# Patient Record
Sex: Female | Born: 1960
Health system: Southern US, Community
[De-identification: ages and names within clinical notes are randomized; demographics above are authoritative.]

## PROBLEM LIST (undated history)

## (undated) DIAGNOSIS — O039 Complete or unspecified spontaneous abortion without complication: Secondary | ICD-10-CM

## (undated) DIAGNOSIS — N3010 Interstitial cystitis (chronic) without hematuria: Secondary | ICD-10-CM

## (undated) DIAGNOSIS — K5909 Other constipation: Secondary | ICD-10-CM

## (undated) DIAGNOSIS — L709 Acne, unspecified: Secondary | ICD-10-CM

## (undated) DIAGNOSIS — M1990 Unspecified osteoarthritis, unspecified site: Secondary | ICD-10-CM

## (undated) DIAGNOSIS — T7840XA Allergy, unspecified, initial encounter: Secondary | ICD-10-CM

## (undated) HISTORY — DX: Complete or unspecified spontaneous abortion without complication: O03.9

## (undated) HISTORY — PX: COLONOSCOPY: SHX174

## (undated) HISTORY — PX: DILATION AND CURETTAGE OF UTERUS: SHX78

## (undated) HISTORY — DX: Other constipation: K59.09

## (undated) HISTORY — DX: Acne, unspecified: L70.9

## (undated) HISTORY — PX: HAND SURGERY: SHX662

## (undated) HISTORY — DX: Unspecified osteoarthritis, unspecified site: M19.90

## (undated) HISTORY — DX: Interstitial cystitis (chronic) without hematuria: N30.10

## (undated) HISTORY — PX: OTHER SURGICAL HISTORY: SHX169

## (undated) HISTORY — PX: BLADDER SURGERY: SHX569

## (undated) HISTORY — PX: BUNIONECTOMY: SHX129

## (undated) HISTORY — PX: WISDOM TOOTH EXTRACTION: SHX21

## (undated) HISTORY — DX: Allergy, unspecified, initial encounter: T78.40XA

## (undated) HISTORY — PX: ENDOMETRIAL ABLATION: SHX621

---

## 1997-11-29 ENCOUNTER — Inpatient Hospital Stay (HOSPITAL_COMMUNITY): Admission: AD | Admit: 1997-11-29 | Discharge: 1997-11-30 | Payer: Self-pay | Admitting: *Deleted

## 1997-12-01 ENCOUNTER — Inpatient Hospital Stay (HOSPITAL_COMMUNITY): Admission: AD | Admit: 1997-12-01 | Discharge: 1997-12-08 | Payer: Self-pay | Admitting: Obstetrics and Gynecology

## 1997-12-08 ENCOUNTER — Encounter (HOSPITAL_COMMUNITY): Admission: RE | Admit: 1997-12-08 | Discharge: 1998-03-08 | Payer: Self-pay | Admitting: Obstetrics and Gynecology

## 1998-01-03 ENCOUNTER — Other Ambulatory Visit: Admission: RE | Admit: 1998-01-03 | Discharge: 1998-01-03 | Payer: Self-pay | Admitting: *Deleted

## 1999-04-23 ENCOUNTER — Other Ambulatory Visit: Admission: RE | Admit: 1999-04-23 | Discharge: 1999-04-23 | Payer: Self-pay | Admitting: Obstetrics and Gynecology

## 2000-05-14 ENCOUNTER — Other Ambulatory Visit: Admission: RE | Admit: 2000-05-14 | Discharge: 2000-05-14 | Payer: Self-pay | Admitting: Obstetrics and Gynecology

## 2000-07-17 ENCOUNTER — Ambulatory Visit (HOSPITAL_COMMUNITY): Admission: RE | Admit: 2000-07-17 | Discharge: 2000-07-17 | Payer: Self-pay | Admitting: Urology

## 2001-07-14 ENCOUNTER — Other Ambulatory Visit: Admission: RE | Admit: 2001-07-14 | Discharge: 2001-07-14 | Payer: Self-pay | Admitting: Obstetrics and Gynecology

## 2002-04-16 ENCOUNTER — Ambulatory Visit (HOSPITAL_COMMUNITY): Admission: RE | Admit: 2002-04-16 | Discharge: 2002-04-16 | Payer: Self-pay | Admitting: *Deleted

## 2002-09-02 ENCOUNTER — Other Ambulatory Visit: Admission: RE | Admit: 2002-09-02 | Discharge: 2002-09-02 | Payer: Self-pay | Admitting: Obstetrics and Gynecology

## 2003-09-08 ENCOUNTER — Other Ambulatory Visit: Admission: RE | Admit: 2003-09-08 | Discharge: 2003-09-08 | Payer: Self-pay | Admitting: Obstetrics and Gynecology

## 2003-09-12 ENCOUNTER — Encounter: Admission: RE | Admit: 2003-09-12 | Discharge: 2003-09-12 | Payer: Self-pay | Admitting: Obstetrics and Gynecology

## 2003-11-10 ENCOUNTER — Encounter (INDEPENDENT_AMBULATORY_CARE_PROVIDER_SITE_OTHER): Payer: Self-pay | Admitting: *Deleted

## 2003-11-10 ENCOUNTER — Ambulatory Visit (HOSPITAL_COMMUNITY): Admission: RE | Admit: 2003-11-10 | Discharge: 2003-11-10 | Payer: Self-pay | Admitting: Obstetrics and Gynecology

## 2004-10-31 ENCOUNTER — Other Ambulatory Visit: Admission: RE | Admit: 2004-10-31 | Discharge: 2004-10-31 | Payer: Self-pay | Admitting: Obstetrics and Gynecology

## 2005-04-17 ENCOUNTER — Ambulatory Visit (HOSPITAL_COMMUNITY): Admission: RE | Admit: 2005-04-17 | Discharge: 2005-04-17 | Payer: Self-pay | Admitting: Plastic Surgery

## 2005-10-23 ENCOUNTER — Ambulatory Visit: Payer: Self-pay | Admitting: Gastroenterology

## 2005-11-20 ENCOUNTER — Ambulatory Visit: Payer: Self-pay | Admitting: Gastroenterology

## 2006-01-13 ENCOUNTER — Ambulatory Visit: Payer: Self-pay | Admitting: Gastroenterology

## 2007-03-30 ENCOUNTER — Ambulatory Visit: Payer: Self-pay | Admitting: Internal Medicine

## 2007-06-18 ENCOUNTER — Encounter: Payer: Self-pay | Admitting: Internal Medicine

## 2007-07-30 ENCOUNTER — Encounter: Payer: Self-pay | Admitting: Internal Medicine

## 2009-03-15 ENCOUNTER — Ambulatory Visit: Payer: Self-pay | Admitting: Internal Medicine

## 2009-06-21 ENCOUNTER — Encounter: Admission: RE | Admit: 2009-06-21 | Discharge: 2009-06-21 | Payer: Self-pay | Admitting: Obstetrics and Gynecology

## 2010-04-20 ENCOUNTER — Ambulatory Visit: Payer: Self-pay | Admitting: Family Medicine

## 2010-06-05 NOTE — Consult Note (Signed)
Summary: GOC note  GOC note   Imported By: Kassie Mends 08/18/2007 09:56:59  _____________________________________________________________________  External Attachment:    Type:   Image     Comment:   GOC note

## 2010-06-05 NOTE — Assessment & Plan Note (Signed)
Summary: FLU MIST WITH SHANNON/CCM  Nurse Visit       Influenza Vaccine    Vaccine Type: Fluvax Non-MCR    Given by: Romualdo Bolk, CMA  Flu Vaccine Consent Questions    Do you have a history of severe allergic reactions to this vaccine? no    Any prior history of allergic reactions to egg and/or gelatin? no    Do you have a sensitivity to the preservative Thimersol? no    Do you have a past history of Guillan-Barre Syndrome? no    Do you currently have an acute febrile illness? no    Have you ever had a severe reaction to latex? no    Vaccine information given and explained to patient? yes    Are you currently pregnant? no   Orders Added: 1)  Influenza Vaccine NON MCR [00028]   Impression & Recommendations:  lot [id number], EXP 30 jun 09, sanofi pasteur left deltoid IM, 0.5 cc. ..................................................................Marland KitchenRomualdo Bolk, CMA  March 30, 2007 5:08 PM     ]

## 2010-06-05 NOTE — Consult Note (Signed)
Summary: [facility]  [facility]   Imported By: Maryln Gottron 08/05/2007 13:39:38  _____________________________________________________________________  External Attachment:    Type:   Image     Comment:   External Document

## 2010-06-05 NOTE — Assessment & Plan Note (Signed)
Summary: FLU SHOT // RS  Nurse Visit   Review of Systems       Flu Vaccine Consent Questions     Do you have a history of severe allergic reactions to this vaccine? no    Any prior history of allergic reactions to egg and/or gelatin? no    Do you have a sensitivity to the preservative Thimersol? no    Do you have a past history of Guillan-Barre Syndrome? no    Do you currently have an acute febrile illness? no    Have you ever had a severe reaction to latex? no    Vaccine information given and explained to patient? yes    Are you currently pregnant? no    Lot Number:[id number]   Exp Date:11/02/2009   Site Given  Left Deltoid IM    Orders Added: 1)  Admin 1st Vaccine [90471] 2)  Flu Vaccine 58yrs + [09811]

## 2010-06-07 NOTE — Assessment & Plan Note (Signed)
Summary: FLU SHOT//ALP  Nurse Visit   Orders Added: 1)  Admin 1st Vaccine [90471] 2)  Flu Vaccine 93yrs + [16109] Flu Vaccine Consent Questions     Do you have a history of severe allergic reactions to this vaccine? no    Any prior history of allergic reactions to egg and/or gelatin? no    Do you have a sensitivity to the preservative Thimersol? no    Do you have a past history of Guillan-Barre Syndrome? no    Do you currently have an acute febrile illness? no    Have you ever had a severe reaction to latex? no    Vaccine information given and explained to patient? yes    Are you currently pregnant? no    Lot Number:[id number]   Exp Date:11/03/2010   Site Given  Left Deltoid IM-CCC]  .[email or web address]

## 2010-09-07 ENCOUNTER — Encounter: Payer: Self-pay | Admitting: Internal Medicine

## 2010-09-07 ENCOUNTER — Ambulatory Visit (INDEPENDENT_AMBULATORY_CARE_PROVIDER_SITE_OTHER): Payer: 59 | Admitting: Internal Medicine

## 2010-09-07 VITALS — BP 120/60 | HR 66 | Temp 98.5°F | Wt 135.0 lb

## 2010-09-07 DIAGNOSIS — K5909 Other constipation: Secondary | ICD-10-CM | POA: Insufficient documentation

## 2010-09-07 DIAGNOSIS — R300 Dysuria: Secondary | ICD-10-CM

## 2010-09-07 DIAGNOSIS — L709 Acne, unspecified: Secondary | ICD-10-CM | POA: Insufficient documentation

## 2010-09-07 DIAGNOSIS — K5900 Constipation, unspecified: Secondary | ICD-10-CM

## 2010-09-07 LAB — POCT URINALYSIS DIPSTICK
Bilirubin, UA: NEGATIVE
Blood, UA: NEGATIVE
Glucose, UA: NEGATIVE
Leukocytes, UA: NEGATIVE
Nitrite, UA: NEGATIVE
Protein, UA: NEGATIVE
Spec Grav, UA: 1.02
Urobilinogen, UA: 0.2
pH, UA: 5.5

## 2010-09-07 MED ORDER — SULFAMETHOXAZOLE-TRIMETHOPRIM 800-160 MG PO TABS: 1.0000 | ORAL_TABLET | Freq: Two times a day (BID) | ORAL | Status: AC

## 2010-09-07 NOTE — Patient Instructions (Signed)
Will  Call about culture results. If no germ and not better after antibiotic we may do a urology referral. Will review the record I can find and plan on another Gi referral to [facility] or other tertiary care center.    Call next week about how you are doing and we can make decision about urology  Consult.

## 2010-09-07 NOTE — Progress Notes (Signed)
Subjective:    Patient ID: Kelsey Mcconnell, female    DOB: January 25, 1961, 50 y.o.   MRN: [id number]  HPI Patient comes in today for an acute visit for the above complaint. He has not been seen in our practice for a number of years and primary care. She had the onset about 2-3 days think of significant urgency and frequency with some dysuria. There is no associated fever flank pain nausea or vomiting.  Coffee and decaf  2 wine per day and spicy" everything."  She has a remote history of significant bladder symptoms similar to these that did not respond to antibiotics and eventually required a bladder evaluation by a urologist. She had some type of surgery or cystoscopy with some questionable scar tissue and after the surgery got better. She's not had recurrent UTIs since that time. Didn't really have dx of IC       Review of Systems Chronic symptoms with constipation ever seen she was a young child. When she was 14 she had evaluation perhaps in [place] which included certain types of bowel studies. She's been taking either laxatives or some type of suppository since she was a young child otherwise her bowel movements will go every 2 weeks or more. She was told that she has a lazy bowel. That her bowel transit is normal until the very lower bowel.  Some time 15 years ago she was given some type of biofeedback therapy in [place] but she wasn't able to continue and it didn't work at the time when her children were young.   Since that time things have not gotten better and it has affected her life where she makes Choices about travel overnights. Needs to use suppositories to evacuate her bowel.    She does not think she ever had a bowel biopsy. She does remember her mother telling her she had this problem when she was very young even when she was in diapers. Unknown if she had an evaluation for Hirschsprung's. Had seen in past Dr Matthias Hughs and last Dr Nita Sells office and is utd on colonsoscopy. 2010  On  ampicillin for acne qd  No weight loss fever sweats  Current blood in stool  Rest of ros [place] or neg  Had ablation so no periods    Objective:   Physical Exam WDWN in nad  HEENT grossly normal. Neck supple without adenopathy Chest nl resp no cva tenderness CV rr  Abd soft  Some tendernss lower quadrants suprapubic and to the left without  g or r and no masses. Well healed c sec scar . EXT: No clubbing cyanosis or edema  Skin no acute rashes no bruising or petechiae Psych: Oriented x 3 and no noted deficits in memory, attention, and speech.    UA neg except trc ketones  Assessment & Plan:  Dysuria urgency UA looks clean however we'll do culture to rule out low count bacterial UTI. Can do empiric rx Treatment over the weekend pending culture . If sx are  persistent or progressive  Consider uro referral  .  Avoid bladder irritants in the meantime.  Chronic severe constipation   .   Unclear cause but very disturbing to her lifestyle .    Consider  relook  Tertiary center  Consult with records.  Asked ir they ruled out  short segment hirschprungs but never had a bx or remember this diagnosis  or conversation.    Review of previous EHR only has one note from Dr. Arlyce Dice  in 2007 but says that she was diagnosed with pelvic floor dyssynergy and she was to have a colonoscopy before biofeedback. There are no other notes in the record.

## 2010-09-09 LAB — URINE CULTURE
Colony Count: NO GROWTH
Organism ID, Bacteria: NO GROWTH

## 2010-09-10 ENCOUNTER — Telehealth: Payer: Self-pay | Admitting: *Deleted

## 2010-09-10 DIAGNOSIS — N390 Urinary tract infection, site not specified: Secondary | ICD-10-CM

## 2010-09-10 NOTE — Telephone Encounter (Signed)
Pt aware of results and wants to see Dr. Maple Hudson at [facility] urology.

## 2010-09-10 NOTE — Telephone Encounter (Signed)
Message copied by Tor Netters on Mon Sep 10, 2010  4:20 PM ------      Message from: [facility], [place] [facility]      Created: Mon Sep 10, 2010  1:15 PM       Tell patient that urine culture is negative .      If continuing we can do a urology reeval.            In regard to the constipation I saw no records in the EHR for the past 5  Years so asked for the paper record which will take a while to  Obtain.

## 2010-09-21 NOTE — Assessment & Plan Note (Signed)
[facility]                           GASTROENTEROLOGY OFFICE NOTE   NAME:Kulick, Marilena M                    MRN:          [id number]  DATE:11/20/2005                            DOB:          1960/08/03    PROBLEM:  Constipation.   Ms. Rinck has returned for scheduled followup.  Review of her previous  records indicates that she does, in fact, have pelvic floor dyssynergy.  She  underwent limited biofeedback training at [facility] in 1994.  She had a  colonoscopy around that time.  Ms. Rud also complains of discomfort  if she does not have a daily bowel movement.  There is no history of  bleeding.   PHYSICAL EXAMINATION:  VITAL SIGNS:  Pulse 76, blood pressure 102/70, weight  127.   IMPRESSION:  Chronic constipation secondary to pelvic floor dyssynergy.   She is concerned about a structural lesion in the colon.  I think this is  less likely but have agreed to proceed with colonoscopy prior to referral  for biofeedback training.                                   Barbette Hair. Arlyce Dice, MD, [facility]   RDK/MedQ  DD:  11/20/2005  DT:  11/20/2005  Job #:  [id number]   cc:   Neta Mends. Fabian Sharp, MD

## 2010-09-21 NOTE — Op Note (Signed)
[name], Kelsey Mcconnell                       ACCOUNT NO.:  [id number]   MEDICAL RECORD NO.:  [id number]                   PATIENT TYPE:  AMB   LOCATION:  SDC                                  [facility]ACILITY:  [facility]   PHYSICIAN:  Duke Salvia. Marcelle Mcconnell, M.D.            DATE OF BIRTH:  1960/08/07   DATE OF PROCEDURE:  11/10/2003  DATE OF DISCHARGE:                                 OPERATIVE REPORT   PREOPERATIVE DIAGNOSIS:  Menorrhagia.   POSTOPERATIVE DIAGNOSIS:  Menorrhagia.   PROCEDURE:  D&C, Novasure endometrial ablation.   SURGEON:  Duke Salvia. Marcelle Mcconnell, M.D.   ANESTHESIA:  Sedation plus paracervical block.   DESCRIPTION OF PROCEDURE:  The patient taken to the operating room and after  an adequate level of sedation was obtained with the patient's legs in  stirrups, the perineum and vagina were prepped and draped with Betadine.  Bladder was drained.  A UA carried out.  The uterus was normal size,  anterior.  The adnexa negative .  Speculum was positioned.  Cervix grasped  with a tenaculum.  Paracervical block was then created by infiltrating at 3  and 9 o'clock submucosally, 5 to 7 mL of 1% Xylocaine on either side after  negative aspiration.  The uterus was then sounded to 8 cm with a cervical  length of 3.0 cm, progressively dilated to a 27 Pratt.  A small endocervical  polyp was removed and sent separately.  Blunt curettage was carried out,  minimal tissue was noted.  The Novasure endometrial ablation was then  carried out per protocol after passing the CO2 test, cavity width was 4.5,  power of 124 for 65 seconds without complications.  She tolerate this well  and went to the recovery room in good condition.  She did receive IV Toradol  postoperatively for pain relief.                                               Kelsey Mcconnell, M.D.    RMH/MEDQ  D:  11/10/2003  T:  11/10/2003  Job:  [id number]

## 2010-09-21 NOTE — Op Note (Signed)
[facility]  Patient:    Kelsey Mcconnell, Kelsey Mcconnell                    MRN: [id number] Proc. Date: 07/17/00 Adm. Date:  [id number] Attending:  Monica Becton                           Operative Report  PREOPERATIVE DIAGNOSIS: 1. Painful bladder syndrome/urethral syndrome. 2. Rule out interstitial cystitis.  POSTOPERATIVE DIAGNOSIS: 1. Painful bladder syndrome/urethral syndrome. 2. Rule out interstitial cystitis.  OPERATION PERFORMED:  Cystoscopy and hydrodistention of urinary bladder under anesthesia.  SURGEON:  Claudette Laws, M.D.  ANESTHESIA:  DESCRIPTION OF PROCEDURE:  The patient was prepped and draped in dorsal lithotomy position under general anesthesia.  Examination of the pelvis was essentially unremarkable.  She had good support I thought anteriorly and poseteriorly.  No obvious urethral diverticula or pelvic masses.  The urethra was normal.  A cystoscopy was performed and the bladder itself looked normal smooth, no active or chronic cystitis, no neoplasia, no calculi, no ulcers, normal ureteral orifices.  Appropriate pictures were taken with the Sony camera.  I then dilated her bladder at 80 cmH2O for approximately three minutes.  After the first hydrodistention, her capacity was about 875 cc.  At the end of the drainage, there was some terminal hematuria, a recystoscopy confirmed some scattered areas of submucosal hemorrhages or glomerulations.  We then redilated her again 80 cmH2O and on the second hydrodistention, we could dilate her to 1000 cc.  Again, we reinspected the bladder but I was not convinced that there were glomerulations present throughout in all four quadrants.  Again appropriate pictures were taken.  At the end of the procedure, I instilled 15 mg of Marcaine and 400 mg of Pyridium for anesthesia and also put in a BNL suppository.  She was given 30 mg of Toradol for pain control before she was taken back to recovery  room.  The patient was then taken back to recovery room in satisfactory condition. At this point based on the above findings, I am not convinced we are dealing with interstitial cystitis.  I will see how the patient gets along with todays treatment and otherwise recommend symptomatic treatment for now. DD:  07/17/00 TD:  07/17/00 Job: [id number]

## 2010-09-21 NOTE — H&P (Signed)
Kelsey Mcconnell, Kelsey Mcconnell                       ACCOUNT NO.:  [id number]   MEDICAL RECORD NO.:  [id number]                   PATIENT TYPE:  AMB   LOCATION:  SDC                                  [facility]ILITY:  [facility]   PHYSICIAN:  Duke Salvia. Marcelle Overlie, M.D.            DATE OF BIRTH:  04/05/1961   DATE OF ADMISSION:  11/10/2003  DATE OF DISCHARGE:                                HISTORY & PHYSICAL   CHIEF COMPLAINT:  Menorrhagia.   HISTORY OF PRESENT ILLNESS:  A 50 year old gravida 4, para 3.  Her husband  has had a vasectomy and her children are 18, 9, and 5.  She has had  continued problems with menorrhagia unresponsive to conservative measures.   Her SHG on October 12, 2003, showed what was felt to be slightly thickened  endometrium, possible adenomyosis, a small tissue buildup at the fundus, and  no other abnormalities noted.  She presents now for Novasure EMA.  I also  plan to do a D&C prior to that just for tissue sampling.   ALLERGIES:  CODEINE.   PAST SURGICAL HISTORY:  None.   REVIEW OF SYSTEMS:  Significant for a history of HSV in the past.  She has  had one prior D&E in 1992 for missed abortion.  She has had three cesarean  section deliveries.   FAMILY HISTORY:  Unremarkable except for a grandmother with leukemia.   PHYSICAL EXAMINATION:  VITAL SIGNS:  Temperature 98.2, blood pressure  120/76.  HEENT:  Unremarkable.  NECK:  Supple without masses.  LUNGS:  Clear.  HEART:  Regular rate and rhythm without murmurs, rubs, or gallops noted.  BREASTS:  Without masses.  ABDOMEN:  Soft, flat, and nontender.  PELVIC:  Normal external genitalia. Vagina and cervix are clear.  Uterus is  midposition, normal size.  Adnexa negative.  EXTREMITIES:  NEUROLOGY:  Unremarkable.   IMPRESSION:  Persistent menorrhagia.   PLAN:  D&C, Novasure endometrial ablation.   The procedure including risks relative to bleeding, infection, and other  complications that may require additional surgery are all  reviewed with her  which she understands and accepts.                                               Richard M. Marcelle Overlie, M.D.    RMH/MEDQ  D:  11/10/2003  T:  11/10/2003  Job:  [id number]

## 2010-11-29 ENCOUNTER — Ambulatory Visit (HOSPITAL_BASED_OUTPATIENT_CLINIC_OR_DEPARTMENT_OTHER)
Admission: RE | Admit: 2010-11-29 | Discharge: 2010-11-29 | Disposition: A | Discharge status: Home / Self Care | Payer: 59 | Source: Ambulatory Visit | Attending: Urology | Admitting: Urology

## 2010-11-29 DIAGNOSIS — R32 Unspecified urinary incontinence: Secondary | ICD-10-CM | POA: Insufficient documentation

## 2010-11-29 DIAGNOSIS — IMO0002 Reserved for concepts with insufficient information to code with codable children: Secondary | ICD-10-CM | POA: Insufficient documentation

## 2010-11-29 DIAGNOSIS — R350 Frequency of micturition: Secondary | ICD-10-CM | POA: Insufficient documentation

## 2010-11-29 DIAGNOSIS — N3010 Interstitial cystitis (chronic) without hematuria: Secondary | ICD-10-CM | POA: Insufficient documentation

## 2010-11-29 DIAGNOSIS — N949 Unspecified condition associated with female genital organs and menstrual cycle: Secondary | ICD-10-CM | POA: Insufficient documentation

## 2010-11-29 LAB — POCT HEMOGLOBIN-HEMACUE: Hemoglobin: 13 g/dL (ref 12.0–15.0)

## 2010-12-11 NOTE — Op Note (Signed)
  Kelsey Mcconnell, Kelsey Mcconnell             ACCOUNT NO.:  [id number]  MEDICAL RECORD NO.:  [id number]  LOCATION:                                 FACILITY:  PHYSICIAN:  Martina Sinner, MD DATE OF BIRTH:  1961-03-24  DATE OF PROCEDURE:  11/28/2010 DATE OF DISCHARGE:                              OPERATIVE REPORT   PREOPERATIVE DIAGNOSIS:  Pelvic pain.  POSTOPERATIVE DIAGNOSIS:  Interstitial cystitis.  SURGERY:  Cystoscopy, bladder distention, bladder instillation therapy.  The patient has pelvic pain, dyspareunia, frequency and mild incontinence.  She consented to the above procedure.  The patient was prepped and draped in usual fashion.  Preoperative antibiotics were given.  A 22 scope was utilized.  Bladder mucosa and trigone were normal.  There was no stitch, foreign body or carcinoma. She was hydrodistended 800 mL.  The bladder was emptied and I reinspected the bladder and she had diffuse glomerulations with minimal bleeding.  I emptied the bladder.  With the bladder emptied and as a separate procedure I inserted red rubber catheter and instilled 15 cc of 0.5% Marcaine plus 400 mg of Pyridium.  The patient will be treated for interstitial cystitis.          ______________________________ Martina Sinner, MD     SAM/MEDQ  D:  11/29/2010  T:  11/29/2010  Job:  [id number]  Electronically Signed by Alfredo Martinez MD on 12/11/2010 07:59:39 AM

## 2011-01-15 ENCOUNTER — Telehealth: Payer: Self-pay | Admitting: *Deleted

## 2011-01-15 NOTE — Telephone Encounter (Signed)
error 

## 2011-03-21 ENCOUNTER — Encounter: Payer: Self-pay | Admitting: Gastroenterology

## 2011-04-11 ENCOUNTER — Ambulatory Visit (INDEPENDENT_AMBULATORY_CARE_PROVIDER_SITE_OTHER): Payer: 59 | Admitting: Gastroenterology

## 2011-04-11 ENCOUNTER — Ambulatory Visit (INDEPENDENT_AMBULATORY_CARE_PROVIDER_SITE_OTHER)
Admission: RE | Admit: 2011-04-11 | Discharge: 2011-04-11 | Disposition: A | Discharge status: Home / Self Care | Payer: 59 | Source: Ambulatory Visit | Attending: Gastroenterology | Admitting: Gastroenterology

## 2011-04-11 ENCOUNTER — Encounter: Payer: Self-pay | Admitting: Gastroenterology

## 2011-04-11 DIAGNOSIS — K5900 Constipation, unspecified: Secondary | ICD-10-CM

## 2011-04-11 DIAGNOSIS — R142 Eructation: Secondary | ICD-10-CM

## 2011-04-11 DIAGNOSIS — R141 Gas pain: Secondary | ICD-10-CM

## 2011-04-11 DIAGNOSIS — R140 Abdominal distension (gaseous): Secondary | ICD-10-CM

## 2011-04-11 DIAGNOSIS — K5909 Other constipation: Secondary | ICD-10-CM

## 2011-04-11 NOTE — Patient Instructions (Signed)
We will get your records from [facility] will need to go to the basement today for your X-Rays

## 2011-04-11 NOTE — Assessment & Plan Note (Addendum)
Her lifelong problems with constipation dating back to infancy raises the question of pelvic floor dysfunction and, less likely, Hirschsprung's disease.  Recommendations #1 check records from [facility] #2 flat plate of the abdomen #3 to consider anal manometry pending results of above

## 2011-04-11 NOTE — Progress Notes (Signed)
History of Present Illness: Kelsey Mcconnell is a pleasant 50 year old white female referred at the request of Dr. Fabian Sharp for evaluation of constipation. This has been a lifelong problem. She claims that her mother noticed that she did not have bowel movements even as an infant. She is dependent on the use of multiple suppositories to have a bowel movement. This consumes a part of every day. She has distention. There is no history of bleeding. She apparently was seen at [facility] approximately 16 years ago but she does not recall the details. Colonoscopy in 2007 was normal. Without the use of bowel stimulants she is unable to move her bowels.    Past Medical History  Diagnosis Date  . Acne   . Arthritis   . Miscarriage   . Chronic constipation     ?  outlet  dysfunction  . Interstitial cystitis    Past Surgical History  Procedure Date  . Ablastion     uterine  . Cesarean section     x 3  . Hand surgery     rt  . Dilation and curettage of uterus   . Bladder surgery     ? dilitation or cytso   family history includes Arthritis in her mother and Fibromyalgia in her mother. Current Outpatient Prescriptions  Medication Sig Dispense Refill  . ampicillin (PRINCIPEN) 500 MG capsule Take 500 mg by mouth daily.         Allergies as of 04/11/2011  . (No Known Allergies)    reports that she has quit smoking. She has never used smokeless tobacco. She reports that she drinks about 8.4 ounces of alcohol per week. She reports that she does not use illicit drugs.     Review of Systems: She has intermittent urinary frequency associated with interstitial cystitis. Pertinent positive and negative review of systems were noted in the above HPI section. All other review of systems were otherwise negative.  Vital signs were reviewed in today's medical record Physical Exam: General: Well developed , well nourished, no acute distress Head: Normocephalic and atraumatic Eyes:  sclerae anicteric,  EOMI Ears: Normal auditory acuity Mouth: No deformity or lesions Neck: Supple, no masses or thyromegaly Lungs: Clear throughout to auscultation Heart: Regular rate and rhythm; no murmurs, rubs or bruits Abdomen: Soft, non tender and non distended. No masses, hepatosplenomegaly or hernias noted. Normal Bowel sounds Rectal:deferred Musculoskeletal: Symmetrical with no gross deformities  Skin: No lesions on visible extremities Pulses:  Normal pulses noted Extremities: No clubbing, cyanosis, edema or deformities noted Neurological: Alert oriented x 4, grossly nonfocal Cervical Nodes:  No significant cervical adenopathy Inguinal Nodes: No significant inguinal adenopathy Psychological:  Alert and cooperative. Normal mood and affect

## 2011-04-12 ENCOUNTER — Encounter: Payer: Self-pay | Admitting: Gastroenterology

## 2011-05-03 ENCOUNTER — Ambulatory Visit (INDEPENDENT_AMBULATORY_CARE_PROVIDER_SITE_OTHER): Payer: 59 | Admitting: Internal Medicine

## 2011-05-03 DIAGNOSIS — Z23 Encounter for immunization: Secondary | ICD-10-CM

## 2011-05-20 ENCOUNTER — Telehealth: Payer: Self-pay | Admitting: Gastroenterology

## 2011-05-20 NOTE — Telephone Encounter (Signed)
I do not see the records from [facility]. I will have to research contact her.

## 2011-05-20 NOTE — Telephone Encounter (Signed)
Pt is calling wanting to know if we have received her records from [facility] yet. Records are scanned into epic under the media tab. Pt also wanted to know if she needed to meet with research or what the plan was for her care. Dr. Arlyce Dice please advise.

## 2011-05-21 NOTE — Telephone Encounter (Signed)
Dr. Arlyce Dice did you have time to review her records from [facility] Any further recommendations? Please advise.

## 2011-05-21 NOTE — Telephone Encounter (Signed)
Spoke with the pt and she is aware. She knows to call back if she chooses not to participate in the research study.

## 2011-05-21 NOTE — Telephone Encounter (Signed)
Records were reviewed.  There's no evidence for Hirschbrung's disease. I have asked research to contact her regarding a constipation trial.  If she does not wish to participate she should let me know.

## 2011-05-27 ENCOUNTER — Telehealth: Payer: Self-pay | Admitting: *Deleted

## 2011-05-27 MED ORDER — LINACLOTIDE 145 MCG PO CAPS: 145.0000 ug | ORAL_CAPSULE | Freq: Every day | ORAL | Status: DC

## 2011-05-27 NOTE — Telephone Encounter (Signed)
The patient is not a candidate for the constipation study.  Let's start Linaclotide 145 mcg daily. Patient should return in about 3 weeks              Spoke with patient and gave her Dr. Marzetta Board recommendations. Rx sent. Scheduled patient on 06/18/11 at 10:00/10:15 AM.

## 2011-06-17 ENCOUNTER — Telehealth: Payer: Self-pay | Admitting: Gastroenterology

## 2011-06-17 MED ORDER — LINACLOTIDE 145 MCG PO CAPS: 145.0000 ug | ORAL_CAPSULE | Freq: Every day | ORAL | Status: DC

## 2011-06-17 NOTE — Telephone Encounter (Signed)
Pt states she cannot afford the Linzess. Spoke with pt and let her know we could give her some samples. Samples left up front for pt to pick up.

## 2011-06-17 NOTE — Telephone Encounter (Signed)
Left message for pt to call back  °

## 2011-06-18 ENCOUNTER — Ambulatory Visit: Payer: 59 | Admitting: Gastroenterology

## 2012-03-25 ENCOUNTER — Ambulatory Visit (INDEPENDENT_AMBULATORY_CARE_PROVIDER_SITE_OTHER): Payer: 59 | Admitting: Family Medicine

## 2012-03-25 DIAGNOSIS — E162 Hypoglycemia, unspecified: Secondary | ICD-10-CM

## 2012-03-25 DIAGNOSIS — Z23 Encounter for immunization: Secondary | ICD-10-CM

## 2012-03-25 LAB — GLUCOSE, POCT (MANUAL RESULT ENTRY): POC Glucose: 76 mg/dl (ref 70–99)

## 2013-03-11 ENCOUNTER — Telehealth: Payer: Self-pay | Admitting: Gastroenterology

## 2013-03-11 NOTE — Telephone Encounter (Signed)
Pt states she has a history of chronic constipation, feels she needs to have a colon done. Pts last colon was done in 2007. Pt scheduled to see Dr. Arlyce Dice 04/06/13@2 :15pm. Pt aware of appt.

## 2013-03-16 ENCOUNTER — Ambulatory Visit (INDEPENDENT_AMBULATORY_CARE_PROVIDER_SITE_OTHER): Payer: 59

## 2013-03-16 DIAGNOSIS — Z23 Encounter for immunization: Secondary | ICD-10-CM

## 2013-04-06 ENCOUNTER — Ambulatory Visit: Payer: 59 | Admitting: Gastroenterology

## 2013-05-17 ENCOUNTER — Ambulatory Visit (INDEPENDENT_AMBULATORY_CARE_PROVIDER_SITE_OTHER): Payer: 59 | Admitting: Gastroenterology

## 2013-05-17 ENCOUNTER — Encounter: Payer: Self-pay | Admitting: Gastroenterology

## 2013-05-17 VITALS — BP 92/70 | HR 68 | Ht 66.5 in | Wt 136.4 lb

## 2013-05-17 DIAGNOSIS — K5900 Constipation, unspecified: Secondary | ICD-10-CM

## 2013-05-17 DIAGNOSIS — K5909 Other constipation: Secondary | ICD-10-CM

## 2013-05-17 MED ORDER — NA SULFATE-K SULFATE-MG SULF 17.5-3.13-1.6 GM/177ML PO SOLN: 1.0000 | Freq: Once | ORAL | Status: DC

## 2013-05-17 NOTE — Assessment & Plan Note (Addendum)
Chronic constipation apparently is 2 to pelvic floor dysfunction as determined by anal manometry.  Outlet obstruction is less likely.  Linzess caused diarrhea.  Recommendations #1 colonoscopy #2 trial of anesthesia 24 mcg twice a day #3 if colonoscopy is negative and symptoms do not improve with Amitiza I will refer her back to [facility] for consideration of biofeedback therapy.

## 2013-05-17 NOTE — Progress Notes (Signed)
    _                                                                                                                History of Present Illness: 53 year old white female referred for reevaluation of constipation.  She was last seen in 2012.  She's had severe constipation since a child.  2007 colonoscopy was normal.  She underwent anal manometry at [facility] which concluded that she had pelvic floor dysfunction.  On Linzess she developed diarrhea and discontinued this.  She takes multiple suppositories daily in order to have a bowel movement.  Otherwise, she may go up to a week.  She has left lower quadrant pain with constipation.  She denies bleeding.  The patient has interstitial cystitis.  When she develops constipation and pressure urinary symptoms worsen.    Past Medical History  Diagnosis Date  . Acne   . Arthritis   . Miscarriage   . Chronic constipation     ?  outlet  dysfunction  . Interstitial cystitis    Past Surgical History  Procedure Laterality Date  . Ablastion      uterine  . Cesarean section      x 3  . Hand surgery      rt  . Dilation and curettage of uterus    . Bladder surgery      ? dilitation or cytso   family history includes Arthritis in her mother; Fibromyalgia in her mother. Current Outpatient Prescriptions  Medication Sig Dispense Refill  . ampicillin (PRINCIPEN) 500 MG capsule Take 500 mg by mouth daily.         No current facility-administered medications for this visit.   Allergies as of 05/17/2013  . (No Known Allergies)    reports that she has quit smoking. She has never used smokeless tobacco. She reports that she drinks about 8.4 ounces of alcohol per week. She reports that she does not use illicit drugs.     Review of Systems: Pertinent positive and negative review of systems were noted in the above HPI section. All other review of systems were otherwise negative.  Vital signs were reviewed in today's medical  record Physical Exam: General: Well developed , well nourished, no acute distress Skin: anicteric Head: Normocephalic and atraumatic Eyes:  sclerae anicteric, EOMI Ears: Normal auditory acuity Mouth: No deformity or lesions Neck: Supple, no masses or thyromegaly Lungs: Clear throughout to auscultation Heart: Regular rate and rhythm; no murmurs, rubs or bruits Abdomen: Soft, non tender and non distended. No masses, hepatosplenomegaly or hernias noted. Normal Bowel sounds Rectal:deferred Musculoskeletal: Symmetrical with no gross deformities  Skin: No lesions on visible extremities Pulses:  Normal pulses noted Extremities: No clubbing, cyanosis, edema or deformities noted Neurological: Alert oriented x 4, grossly nonfocal Cervical Nodes:  No significant cervical adenopathy Inguinal Nodes: No significant inguinal adenopathy Psychological:  Alert and cooperative. Normal mood and affect  See Assessment and Plan under Problem List

## 2013-05-17 NOTE — Patient Instructions (Signed)
You have been scheduled for a colonoscopy with propofol. Please follow written instructions given to you at your visit today.  Please pick up your prep kit at the pharmacy within the next 1-3 days. If you use inhalers (even only as needed), please bring them with you on the day of your procedure. Your physician has requested that you go to www.startemmi.com and enter the access code given to you at your visit today. This web site gives a general overview about your procedure. However, you should still follow specific instructions given to you by our office regarding your preparation for the procedure.  AMITIZA SAMPLES GIVEN

## 2013-05-19 ENCOUNTER — Encounter: Payer: Self-pay | Admitting: Gastroenterology

## 2013-06-30 ENCOUNTER — Telehealth: Payer: Self-pay | Admitting: Gastroenterology

## 2013-07-07 ENCOUNTER — Ambulatory Visit (AMBULATORY_SURGERY_CENTER): Payer: 59 | Admitting: Gastroenterology

## 2013-07-07 ENCOUNTER — Encounter: Payer: Self-pay | Admitting: Gastroenterology

## 2013-07-07 VITALS — BP 102/63 | HR 56 | Temp 97.7°F | Resp 14 | Ht 66.5 in | Wt 136.0 lb

## 2013-07-07 DIAGNOSIS — D126 Benign neoplasm of colon, unspecified: Secondary | ICD-10-CM

## 2013-07-07 DIAGNOSIS — K5900 Constipation, unspecified: Secondary | ICD-10-CM

## 2013-07-07 DIAGNOSIS — K648 Other hemorrhoids: Secondary | ICD-10-CM

## 2013-07-07 MED ORDER — SODIUM CHLORIDE 0.9 % IV SOLN: 500.0000 mL | INTRAVENOUS | Status: DC

## 2013-07-07 NOTE — Patient Instructions (Signed)
YOU HAD AN ENDOSCOPIC PROCEDURE TODAY AT THE [facility]: Refer to the procedure report that was given to you for any specific questions about what was found during the examination.  If the procedure report does not answer your questions, please call your gastroenterologist to clarify.  If you requested that your care partner not be given the details of your procedure findings, then the procedure report has been included in a sealed envelope for you to review at your convenience later.  YOU SHOULD EXPECT: Some feelings of bloating in the abdomen. Passage of more gas than usual.  Walking can help get rid of the air that was put into your GI tract during the procedure and reduce the bloating. If you had a lower endoscopy (such as a colonoscopy or flexible sigmoidoscopy) you may notice spotting of blood in your stool or on the toilet paper. If you underwent a bowel prep for your procedure, then you may not have a normal bowel movement for a few days.  DIET: Your first meal following the procedure should be a light meal and then it is ok to progress to your normal diet.  A half-sandwich or bowl of soup is an example of a good first meal.  Heavy or fried foods are harder to digest and may make you feel nauseous or bloated.  Likewise meals heavy in dairy and vegetables can cause extra gas to form and this can also increase the bloating.  Drink plenty of fluids but you should avoid alcoholic beverages for 24 hours.  ACTIVITY: Your care partner should take you home directly after the procedure.  You should plan to take it easy, moving slowly for the rest of the day.  You can resume normal activity the day after the procedure however you should NOT DRIVE or use heavy machinery for 24 hours (because of the sedation medicines used during the test).    SYMPTOMS TO REPORT IMMEDIATELY: A gastroenterologist can be reached at any hour.  During normal business hours, 8:30 AM to 5:00 PM Monday through Friday,  call [phone].  After hours and on weekends, please call the GI answering service at [phone] who will take a message and have the physician on call contact you.   Following lower endoscopy (colonoscopy or flexible sigmoidoscopy):  Excessive amounts of blood in the stool  Significant tenderness or worsening of abdominal pains  Swelling of the abdomen that is new, acute  Fever of 100F or higher  FOLLOW UP: If any biopsies were taken you will be contacted by phone or by letter within the next 1-3 weeks.  Call your gastroenterologist if you have not heard about the biopsies in 3 weeks.  Our staff will call the home number listed on your records the next business day following your procedure to check on you and address any questions or concerns that you may have at that time regarding the information given to you following your procedure. This is a courtesy call and so if there is no answer at the home number and we have not heard from you through the emergency physician on call, we will assume that you have returned to your regular daily activities without incident.  SIGNATURES/CONFIDENTIALITY: You and/or your care partner have signed paperwork which will be entered into your electronic medical record.  These signatures attest to the fact that that the information above on your After Visit Summary has been reviewed and is understood.  Full responsibility of the confidentiality of this   discharge information lies with you and/or your care-partner.  Polyps, hemorrhoids-handouts given  Wait biopsy results.  Trial of amitiza.  Office visit in 4 weeks, please call office to schedule this if office has not called by 07/09/13.

## 2013-07-07 NOTE — Progress Notes (Signed)
Called to room to assist during endoscopic procedure.  Patient ID and intended procedure confirmed with present staff. Received instructions for my participation in the procedure from the performing physician.  

## 2013-07-07 NOTE — Op Note (Signed)
[place]  [address] [place], [address]   COLONOSCOPY PROCEDURE REPORT  PATIENT: [name], [name]  MR#: [id number] BIRTHDATE: 07-21-1960 , 52  yrs. old GENDER: Female ENDOSCOPIST: Inda Castle, MD REFERRED BY: PROCEDURE DATE:  07/07/2013 PROCEDURE:   Colonoscopy with snare polypectomy and Colonoscopy with cold biopsy polypectomy First Screening Colonoscopy - Avg.  risk and is 50 yrs.  old or older - No.  Prior Negative Screening - Now for repeat screening. Other: See Comments  History of Adenoma - Now for follow-up colonoscopy & has been > or = to 3 yrs.  N/A  Polyps Removed Today? Yes. ASA CLASS:   Class II INDICATIONS:Constipation. MEDICATIONS: MAC sedation, administered by CRNA and propofol (Diprivan) 250mg  IV  DESCRIPTION OF PROCEDURE:   After the risks benefits and alternatives of the procedure were thoroughly explained, informed consent was obtained.  A digital rectal exam revealed no abnormalities of the rectum.   The LB [id number]  endoscope was introduced through the anus and advanced to the cecum, which was identified by both the appendix and ileocecal valve. No adverse events experienced.   The quality of the prep was Suprep good  The instrument was then slowly withdrawn as the colon was fully examined.      COLON FINDINGS: A flat polyp measuring 3 mm in size was found at the cecum.  A polypectomy was performed with a cold snare.  The resection was complete and the polyp tissue was completely retrieved.   A flat polyp measuring 3 mm in size was found in the ascending colon.  A polypectomy was performed.  A polypectomy was performed with a cold snare.  The resection was complete and the polyp tissue was completely retrieved.   A sessile polyp measuring 2 mm in size was found in the sigmoid colon.  A polypectomy was performed with cold forceps.   A sessile polyp measuring 2 mm in size was found in the rectum.  A polypectomy was  performed with cold forceps.   Internal hemorrhoids were found.  Retroflexed views revealed no abnormalities. The time to cecum=2 minutes 15 seconds. Withdrawal time=14 minutes 25 seconds.  The scope was withdrawn and the procedure completed. COMPLICATIONS: There were no complications.  ENDOSCOPIC IMPRESSION: 1.   Flat polyp measuring 3 mm in size was found at the cecum; polypectomy was performed with a cold snare 2.   Flat polyp measuring 3 mm in size was found in the ascending colon; polypectomy was performed; polypectomy was performed with a cold snare 3.   Sessile polyp measuring 2 mm in size was found in the sigmoid colon; polypectomy was performed with cold forceps 4.   Sessile polyp measuring 2 mm in size was found in the rectum; polypectomy was performed with cold forceps 5.   Internal hemorrhoids  RECOMMENDATIONS: 1.  Await biopsy results 2.  trial of amitiza 3.  OV 4 weeks   eSigned:  Inda Castle, MD 07/07/2013 11:45 AM   cc: Burnis Medin, MD   PATIENT NAME:  Kelsey Mcconnell, Kelsey Mcconnell MR#: [id number]

## 2013-07-07 NOTE — Progress Notes (Signed)
Lidocaine-40mg IV prior to Propofol InductionPropofol given over incremental dosages 

## 2013-07-08 ENCOUNTER — Telehealth: Payer: Self-pay | Admitting: *Deleted

## 2013-07-08 NOTE — Telephone Encounter (Signed)
  Follow up Call-  Call back number 07/07/2013  Post procedure Call Back phone  # [phone] cell  Permission to leave phone message Yes   [facility]

## 2013-07-12 ENCOUNTER — Encounter: Payer: Self-pay | Admitting: Gastroenterology

## 2013-08-16 ENCOUNTER — Ambulatory Visit: Payer: 59 | Admitting: Gastroenterology

## 2013-09-03 ENCOUNTER — Ambulatory Visit (INDEPENDENT_AMBULATORY_CARE_PROVIDER_SITE_OTHER): Payer: 59 | Admitting: Gastroenterology

## 2013-09-03 ENCOUNTER — Encounter: Payer: Self-pay | Admitting: Gastroenterology

## 2013-09-03 VITALS — BP 90/60 | HR 76 | Ht 66.5 in | Wt 134.0 lb

## 2013-09-03 DIAGNOSIS — Z860100 Personal history of colon polyps, unspecified: Secondary | ICD-10-CM | POA: Insufficient documentation

## 2013-09-03 DIAGNOSIS — K5909 Other constipation: Secondary | ICD-10-CM

## 2013-09-03 DIAGNOSIS — K5900 Constipation, unspecified: Secondary | ICD-10-CM

## 2013-09-03 NOTE — Progress Notes (Signed)
          History of Present Illness:  The patient has returned following colonoscopy where adenomatous polyps and a serrated adenoma were removed.  Constipation continues.  On Linzess 145 mcg she developed diarrhea.  She has not taken twice a day Amitiza .  After single dose she has had some headaches.    Review of Systems: Pertinent positive and negative review of systems were noted in the above HPI section. All other review of systems were otherwise negative.    Current Medications, Allergies, Past Medical History, Past Surgical History, Family History and Social History were reviewed in [facility] record  Vital signs were reviewed in today's medical record. Physical Exam: General: Well developed , well nourished, no acute distress   See Assessment and Plan under Problem List

## 2013-09-03 NOTE — Assessment & Plan Note (Signed)
Constipation is most likely due to pelvic floor dysfunction.  Linzess cause diarrhea.  Plan to try Amitiza.  Should she not tolerate it or it is ineffective I will refer her back to [facility] for biofeedback therapy.

## 2013-09-03 NOTE — Assessment & Plan Note (Signed)
Following colonoscopy 2020

## 2013-09-03 NOTE — Patient Instructions (Signed)
Call back in 1 week to report progress

## 2013-10-20 ENCOUNTER — Ambulatory Visit (INDEPENDENT_AMBULATORY_CARE_PROVIDER_SITE_OTHER): Payer: 59 | Admitting: Emergency Medicine

## 2013-10-20 ENCOUNTER — Ambulatory Visit (INDEPENDENT_AMBULATORY_CARE_PROVIDER_SITE_OTHER): Payer: 59

## 2013-10-20 VITALS — BP 116/76 | HR 78 | Temp 98.3°F | Resp 16 | Ht 67.25 in | Wt 133.8 lb

## 2013-10-20 DIAGNOSIS — M79609 Pain in unspecified limb: Secondary | ICD-10-CM

## 2013-10-20 DIAGNOSIS — M79671 Pain in right foot: Secondary | ICD-10-CM

## 2013-10-20 DIAGNOSIS — S92919A Unspecified fracture of unspecified toe(s), initial encounter for closed fracture: Secondary | ICD-10-CM

## 2013-10-20 NOTE — Patient Instructions (Signed)

## 2013-10-20 NOTE — Progress Notes (Signed)
[facility] [address], [place] [address] [phone]  Date:  10/20/2013   Name:  Kelsey Mcconnell   DOB:  Jan 13, 1961   MRN:  [id number]  PCP:  Lottie Dawson, MD    Chief Complaint: Toe Injury   History of Present Illness:  Kelsey Mcconnell is a 53 y.o. very pleasant female patient who presents with the following:  Stubbed her toes on a coffee table 2 weeks ago and is concerned that she still has pain and swelling in her small toe.  No improvement with over the counter medications or other home remedies. Denies other complaint or health concern today.   Patient Active Problem List   Diagnosis Date Noted  . Personal history of colonic polyps 09/03/2013  . Constipation 04/11/2011  . Acne     Past Medical History  Diagnosis Date  . Acne   . Arthritis   . Miscarriage   . Chronic constipation     ?  outlet  dysfunction  . Interstitial cystitis   . Allergy     Past Surgical History  Procedure Laterality Date  . Ablastion      uterine  . Cesarean section      x 3  . Hand surgery      rt  . Dilation and curettage of uterus    . Bladder surgery      ? dilitation or cytso  . Wisdom tooth extraction    . Colonoscopy      History  Substance Use Topics  . Smoking status: Former Research scientist (life sciences)  . Smokeless tobacco: Never Used  . Alcohol Use: 8.4 oz/week    14 Glasses of wine per week    Family History  Problem Relation Age of Onset  . Arthritis Mother   . Fibromyalgia Mother   . Colon cancer Neg Hx   . Esophageal cancer Neg Hx   . Pancreatic cancer Neg Hx   . Rectal cancer Neg Hx   . Stomach cancer Neg Hx     Allergies  Allergen Reactions  . Codeine Nausea And Vomiting    Medication list has been reviewed and updated.  Current Outpatient Prescriptions on File Prior to Visit  Medication Sig Dispense Refill  . ampicillin (PRINCIPEN) 500 MG capsule Take 500 mg by mouth daily.        Marland Kitchen lubiprostone (AMITIZA) 24 MCG capsule Take  24 mcg by mouth daily with breakfast.      . MAGNESIUM CITRATE PO Take 1 tablet by mouth daily.       No current facility-administered medications on file prior to visit.    Review of Systems:  As per HPI, otherwise negative.    Physical Examination: Filed Vitals:   10/20/13 1250  BP: 116/76  Pulse: 78  Temp: 98.3 F (36.8 C)  Resp: 16   Filed Vitals:   10/20/13 1250  Height: 5' 7.25" (1.708 m)  Weight: 133 lb 12.8 oz (60.691 kg)   Body mass index is 20.8 kg/(m^2). Ideal Body Weight: Weight in (lb) to have BMI = 25: 160.5   GEN: WDWN, NAD, Non-toxic, Alert & Oriented x 3 HEENT: Atraumatic, Normocephalic.  Ears and Nose: No external deformity. EXTR: No clubbing/cyanosis/edema NEURO: Normal gait.  PSYCH: Normally interactive. Conversant. Not depressed or anxious appearing.  Calm demeanor.  RIGHT foot:   Tender and swollen right small toe and second toe.  No deformity or ecchymosis. Assessment and Plan: Fracture fifth toe Continue buddy taping  Signed,  Ellison Carwin, MD   [facility] reading (PRIMARY) by  Dr. Ouida Sills  Fracture fifth toe.Marland Kitchen

## 2014-06-07 ENCOUNTER — Other Ambulatory Visit (INDEPENDENT_AMBULATORY_CARE_PROVIDER_SITE_OTHER): Payer: 59

## 2014-06-07 ENCOUNTER — Telehealth: Payer: Self-pay | Admitting: Gastroenterology

## 2014-06-07 ENCOUNTER — Encounter: Payer: Self-pay | Admitting: Physician Assistant

## 2014-06-07 ENCOUNTER — Ambulatory Visit (INDEPENDENT_AMBULATORY_CARE_PROVIDER_SITE_OTHER): Payer: 59 | Admitting: Physician Assistant

## 2014-06-07 VITALS — BP 102/68 | HR 68 | Ht 67.0 in | Wt 140.0 lb

## 2014-06-07 DIAGNOSIS — R197 Diarrhea, unspecified: Secondary | ICD-10-CM

## 2014-06-07 DIAGNOSIS — R109 Unspecified abdominal pain: Secondary | ICD-10-CM

## 2014-06-07 LAB — CBC WITH DIFFERENTIAL/PLATELET
Basophils Absolute: 0 10*3/uL (ref 0.0–0.1)
Basophils Relative: 0.4 % (ref 0.0–3.0)
Eosinophils Absolute: 2.8 10*3/uL — ABNORMAL HIGH (ref 0.0–0.7)
Eosinophils Relative: 24.5 % — ABNORMAL HIGH (ref 0.0–5.0)
HCT: 43.4 % (ref 36.0–46.0)
Hemoglobin: 15.3 g/dL — ABNORMAL HIGH (ref 12.0–15.0)
Lymphocytes Relative: 19.1 % (ref 12.0–46.0)
Lymphs Abs: 2.2 10*3/uL (ref 0.7–4.0)
MCHC: 35.2 g/dL (ref 30.0–36.0)
MCV: 87.1 fl (ref 78.0–100.0)
Monocytes Absolute: 0.7 10*3/uL (ref 0.1–1.0)
Monocytes Relative: 5.7 % (ref 3.0–12.0)
Neutro Abs: 5.8 10*3/uL (ref 1.4–7.7)
Neutrophils Relative %: 50.3 % (ref 43.0–77.0)
Platelets: 303 10*3/uL (ref 150.0–400.0)
RBC: 4.99 Mil/uL (ref 3.87–5.11)
RDW: 12.2 % (ref 11.5–15.5)
WBC: 11.4 10*3/uL — ABNORMAL HIGH (ref 4.0–10.5)

## 2014-06-07 LAB — C-REACTIVE PROTEIN: CRP: 0.2 mg/dL — ABNORMAL LOW (ref 0.5–20.0)

## 2014-06-07 MED ORDER — DICYCLOMINE HCL 10 MG PO CAPS: 10.0000 mg | ORAL_CAPSULE | Freq: Every day | ORAL | Status: DC

## 2014-06-07 NOTE — Patient Instructions (Signed)
Please start on a probiotic daily-  Align, Culturelle, or Florastor for 1 month  We have sent a prescription to your pharmacy: Bentyl you will take one daily  Please go to the basement lab for blood work and stool studies

## 2014-06-07 NOTE — Telephone Encounter (Signed)
1 week history of a sensation of heat or burning in her lower abdomen after eating. It occurs in about an hour. Then there is gurgling and gastric distress. She does not take anything orally for her chronic constipation, uses a suppository. Her bowel movements are loose irritated movements now. Denies bloody stools, nausea or fever. Appointment made.

## 2014-06-07 NOTE — Progress Notes (Signed)
Patient ID: Kelsey Mcconnell, female   DOB: 03-May-1961, 54 y.o.   MRN: [id number]   Subjective:    Patient ID: Kelsey Mcconnell, female    DOB: [birth year], 54 y.o.   MRN: [id number]  HPI  Kelsey Mcconnell is a 54 year old white female known to Dr. Deatra Ina. She has history of chronic constipation and had undergone colonoscopy in March 2015. She was found to have 4 polyps all of which were removed. Path worse consistent with adenomatous polyps and a serrated adenoma. She is to have a 5 year interval follow-up. He states that she has had constipation problems her entire life and generally has found using a glycerin suppository or 2 each day to be most helpful. He says she's tried a lot of laxatives over the years and generally just winds up increasing doses without much benefit. She had been given a trial of Linzess  145 g by mouth daily apparently this gave her diarrhea. She most recently tried Amitiza- says she did not feel well on the Amitiza so stopped taking it. She comes in today with new complaint of diarrhea over the past 1 week. She's also complaining of burning mid abdominal pain especially after eating followed by cramping urgency and diarrhea. She is also complaining of abdominal bloating. No nausea or vomiting no fever or chills no melena or hematochezia. Says she hasn't felt well over the past few days. No new medications no recent antibiotics no other family members ill. She is generally just having 2-3 bowel movements per day and says she may have diarrhea 1 day and then no BM at all all the next day. She does chronically take ampicillin 500 mg daily which she's been doing for many years for acne.   Review of Systems Pertinent positive and negative review of systems were noted in the above HPI section.  All other review of systems was otherwise negative.  Outpatient Encounter Prescriptions as of 06/07/2014  Medication Sig  . AMBULATORY NON FORMULARY MEDICATION Medication Name: glycerine suppository  Daily  for chronic constipation  . ampicillin (PRINCIPEN) 500 MG capsule Take 500 mg by mouth daily.    Marland Kitchen estradiol (VIVELLE-DOT) 0.05 MG/24HR patch Place 1 patch onto the skin 2 (two) times a week.  . dicyclomine (BENTYL) 10 MG capsule Take 1 capsule (10 mg total) by mouth daily.  Marland Kitchen MAGNESIUM CITRATE PO Take 1 tablet by mouth daily.  . [DISCONTINUED] lubiprostone (AMITIZA) 24 MCG capsule Take 24 mcg by mouth daily with breakfast.   Allergies  Allergen Reactions  . Codeine Nausea And Vomiting   Patient Active Problem List   Diagnosis Date Noted  . Personal history of colonic polyps 09/03/2013  . Constipation 04/11/2011  . Acne    History   Social History  . Marital Status: Married    Spouse Name: N/A    Number of Children: 3  . Years of Education: N/A   Occupational History  . Stay at Home MOM    Social History Main Topics  . Smoking status: Former Research scientist (life sciences)  . Smokeless tobacco: Never Used  . Alcohol Use: 8.4 oz/week    14 Glasses of wine per week  . Drug Use: No  . Sexual Activity: Not on file   Other Topics Concern  . Not on file   Social History Narrative   hh of 5    wine 2 per night    2 years of college    Married   Neg ets firearms wears seatbelts  G4 P3   Reg exercise    Ms. [name]'s family history includes Arthritis in her mother; Fibromyalgia in her mother. There is no history of Colon cancer, Esophageal cancer, Pancreatic cancer, Rectal cancer, or Stomach cancer.      Objective:    Filed Vitals:   06/07/14 1413  BP: 102/68  Pulse: 68    Physical Exam  white female in no acute distress, blood pressure 102/68 pulse 68 height 5 foot 7 weight 140. HEENT; nontraumatic normocephalic EOMI PERRLA sclera anicteric, Supple ;no JVD, Cardiovascular ;regular rate and rhythm with S1-S2 no murmur or gallop, Pulmonary; clear bilaterally, Abdomen ;soft bowel sounds are present and some mild mid abdominal tenderness no guarding or rebound no palpable mass or  hepatosplenomegaly ,, Rectal exam; not done, Extremities ;no clubbing cyanosis or edema skin warm and dry, Psych; mood and affect appropriate     Assessment & Plan:   #1 54 yo female with hx of long term chronic constipation -now with one week hx of diarrhea, and mid abdominal burning discomfort/urgency. I suspect she has an infectious diarrhea, perhaps viral cannot rule out bacterial #2 adenomatous polyps and serrated adenoma -last colonoscopy March 2015 will be due for follow-up 2020  Plan; CBC with differential, CRP, GI pathogen panel Start daily probiotic 1 month suggested culturelle  align, or Florastor Add Bentyl 10 mg by mouth twice a day when necessary for cramping and urgency Further plans pending results of above    CHANAH TIDMORE PA-C 06/07/2014

## 2014-06-08 ENCOUNTER — Other Ambulatory Visit: Payer: 59

## 2014-06-08 DIAGNOSIS — R109 Unspecified abdominal pain: Secondary | ICD-10-CM

## 2014-06-08 DIAGNOSIS — R197 Diarrhea, unspecified: Secondary | ICD-10-CM

## 2014-06-08 NOTE — Progress Notes (Signed)
Reviewed and agree with management. Saydie Gerdts D. Amil Moseman, M.D., FACG  

## 2014-06-09 ENCOUNTER — Telehealth: Payer: Self-pay | Admitting: Physician Assistant

## 2014-06-09 LAB — GASTROINTESTINAL PATHOGEN PANEL PCR
C. difficile Tox A/B, PCR: NEGATIVE
Campylobacter, PCR: NEGATIVE
Cryptosporidium, PCR: NEGATIVE
E coli (ETEC) LT/ST PCR: NEGATIVE
E coli (STEC) stx1/stx2, PCR: NEGATIVE
E coli 0157, PCR: NEGATIVE
Giardia lamblia, PCR: NEGATIVE
Norovirus, PCR: NEGATIVE
Rotavirus A, PCR: NEGATIVE
Salmonella, PCR: NEGATIVE
Shigella, PCR: NEGATIVE

## 2014-06-10 NOTE — Telephone Encounter (Signed)
Patient has not tried using the Bentyl yet. She is afraid of constipation. Stool Studies are negative. No new symptoms. She feels an urgency after her 2nd meal of the day with the "burning" sensation. Reassured. Agrees to try the medication today to see if it helps. As per OV note, it may be a viral syndrome. Is there anything else I should offer her?

## 2014-06-12 NOTE — Telephone Encounter (Signed)
No, not at this time.

## 2014-06-15 ENCOUNTER — Telehealth: Payer: Self-pay | Admitting: Internal Medicine

## 2014-06-15 ENCOUNTER — Telehealth: Payer: Self-pay | Admitting: Physician Assistant

## 2014-06-15 DIAGNOSIS — N3010 Interstitial cystitis (chronic) without hematuria: Secondary | ICD-10-CM | POA: Insufficient documentation

## 2014-06-15 NOTE — Telephone Encounter (Signed)
She is not doing any better. She remains afebrile. She has to use a suppository to have a bowel movement every morning, but she will have spells of diarrhea in the late afternoon or evening. She does not see any blood with the BM's. She has developed a burning heavy feeling in her bladder in the afternoons. She says this is different from the burning feeling she deals with normally. She is uncertain if she has a bladder infection. She is going out of town in a few hours, therefore she will go to an Urgent Care for evaluation of possible UTI. The Bentyl has not helped. She has stopped it due to ineffectiveness and the fatigue it causes. Patient will call tomorrow with an update. She is concerned about diverticulitis.

## 2014-06-15 NOTE — Telephone Encounter (Signed)
Spoke to the pt.  Stated she is having abdominal pain (lower).  Denied any fever.  Appt book is full for today with [facility] and other providers.  Offered an appt for 06/16/14 but she stated she is going out of town today at 4 PM to take her daugher to a swim meet.  She will go to her local urgent care for treatment.

## 2014-06-15 NOTE — Telephone Encounter (Signed)
I have left a message for the patient to call back  

## 2014-06-15 NOTE — Telephone Encounter (Signed)
Ok- she will need to be re-evaluated- not sure why she is feeling so bad- she did not have any diverticulosis noted at time of colonoscopy so doubt diverticulitis- see if she can get worked in with [employer]

## 2014-06-15 NOTE — Telephone Encounter (Signed)
Pt talk with [facility] and they recommended  pt see md today or go to urgent care. Pt would like to come in for urine test to see if she had bladder inf. Pt is having lower abd pain

## 2014-06-24 ENCOUNTER — Encounter: Payer: Self-pay | Admitting: Gastroenterology

## 2014-06-24 ENCOUNTER — Ambulatory Visit (INDEPENDENT_AMBULATORY_CARE_PROVIDER_SITE_OTHER): Payer: 59 | Admitting: Gastroenterology

## 2014-06-24 ENCOUNTER — Other Ambulatory Visit: Payer: 59

## 2014-06-24 VITALS — BP 88/60 | HR 60 | Resp 14 | Ht 67.0 in | Wt 139.4 lb

## 2014-06-24 DIAGNOSIS — R109 Unspecified abdominal pain: Secondary | ICD-10-CM

## 2014-06-24 DIAGNOSIS — K5900 Constipation, unspecified: Secondary | ICD-10-CM

## 2014-06-24 MED ORDER — HYOSCYAMINE SULFATE 0.125 MG SL SUBL: 0.2500 mg | SUBLINGUAL_TABLET | SUBLINGUAL | Status: DC | PRN

## 2014-06-24 NOTE — Patient Instructions (Signed)
Go to the basement today for labs We are sending in a prescription to your pharmacy [facility] is speaking with you today

## 2014-06-24 NOTE — Assessment & Plan Note (Addendum)
Constipation is most likely due to pelvic floor dysfunction.  Linzess cause diarrhea.  Amitiza was ineffective.  Patient has pain associated with constipation and likely has IBS as well.  Recent episode of diarrhea and pain is likely do to intercurrent enteritis.  She is also concerned about lactose intolerance.  Recommendations #1 patient will consider enrollment in a IBS/constipation trial #2 hyomax as needed #3 should patient not enrollment in an IBS trial I would still consider biofeedback therapy for pelvic floor dysfunction #4 lactose breath test

## 2014-06-24 NOTE — Progress Notes (Signed)
      History of Present Illness:  Ms. Lucero S return for follow-up of diarrhea and crampy abdominal pain.  She's taking a daily probiotic.  Tends have improved although they remain.  She has intermittent episodes of watery diarrhea and crampy abdominal pain.  This is distinct from her usual bouts of severe constipation.  Therapy with Linzess and Amitiza was unsuccessful.  She developed severe fatigue with Bentyl and had to discontinue this.  Stool pathogen panel was negative.    Review of Systems: Pertinent positive and negative review of systems were noted in the above HPI section. All other review of systems were otherwise negative.    Current Medications, Allergies, Past Medical History, Past Surgical History, Family History and Social History were reviewed in [facility] record  Vital signs were reviewed in today's medical record. Physical Exam: General: Well developed , well nourished, no acute distress Abdomen is without masses, tenderness organomegaly  See Assessment and Plan under Problem List

## 2014-06-28 LAB — LACTOSE TOLERANCE TEST

## 2014-07-06 ENCOUNTER — Telehealth: Payer: Self-pay

## 2014-07-06 ENCOUNTER — Other Ambulatory Visit: Payer: Self-pay

## 2014-07-06 DIAGNOSIS — R1013 Epigastric pain: Secondary | ICD-10-CM

## 2014-07-06 NOTE — Telephone Encounter (Signed)
Patient aware and will return for labs to be redrawn.

## 2014-07-27 ENCOUNTER — Other Ambulatory Visit: Payer: 59

## 2014-07-27 DIAGNOSIS — R1013 Epigastric pain: Secondary | ICD-10-CM

## 2014-07-30 LAB — CELIAC PANEL 10
Endomysial Screen: NEGATIVE
Gliadin IgA: 4 Units (ref <20)
Gliadin IgG: 1 Units (ref <20)
IgA: 195 mg/dL (ref 69–380)
Tissue Transglut Ab: 1 U/mL (ref <6)
Tissue Transglutaminase Ab, IgA: 1 U/mL (ref <4)

## 2014-08-01 LAB — LACTOSE TOLERANCE TEST

## 2014-11-10 ENCOUNTER — Other Ambulatory Visit: Payer: Self-pay | Admitting: Obstetrics and Gynecology

## 2014-11-11 LAB — CYTOLOGY - PAP

## 2014-12-05 ENCOUNTER — Encounter: Payer: Self-pay | Admitting: Podiatry

## 2014-12-05 ENCOUNTER — Ambulatory Visit (INDEPENDENT_AMBULATORY_CARE_PROVIDER_SITE_OTHER): Payer: 59 | Admitting: Podiatry

## 2014-12-05 ENCOUNTER — Ambulatory Visit (INDEPENDENT_AMBULATORY_CARE_PROVIDER_SITE_OTHER): Payer: 59

## 2014-12-05 VITALS — BP 113/65 | HR 71 | Resp 15

## 2014-12-05 DIAGNOSIS — M79672 Pain in left foot: Secondary | ICD-10-CM

## 2014-12-05 DIAGNOSIS — M79671 Pain in right foot: Secondary | ICD-10-CM

## 2014-12-05 DIAGNOSIS — L600 Ingrowing nail: Secondary | ICD-10-CM

## 2014-12-05 DIAGNOSIS — M2010 Hallux valgus (acquired), unspecified foot: Secondary | ICD-10-CM

## 2014-12-05 NOTE — Progress Notes (Signed)
   Subjective:    Patient ID: Kelsey Mcconnell, female    DOB: [birth year], 54 y.o.   MRN: [id number]  HPI Patient presents with a nail problem on their right foot, 3rd toe, bleeding around nail bed, started this morning (12/05/14); pt stated, "been purple and sore for the past week"   Review of Systems  All other systems reviewed and are negative.      Objective:   Physical Exam        Assessment & Plan:

## 2014-12-05 NOTE — Patient Instructions (Signed)

## 2014-12-06 ENCOUNTER — Telehealth: Payer: Self-pay | Admitting: *Deleted

## 2014-12-06 NOTE — Progress Notes (Signed)
Subjective:     Patient ID: Kelsey Mcconnell, female   DOB: 11/17/1960, 55 y.o.   MRN: [id number]  HPI patient states I have an irritated toe third right that I have broken in the last couple years and it's become tender over the last month. I also have structural bunion deformity of both feet that is becoming increasingly discomforting right over left   Review of Systems  All other systems reviewed and are negative.      Objective:   Physical Exam  Constitutional: She is oriented to person, place, and time.  Cardiovascular: Intact distal pulses.   Musculoskeletal: Normal range of motion.  Neurological: She is oriented to person, place, and time.  Skin: Skin is warm.  Nursing note and vitals reviewed.  neurovascular status found to be intact with muscle strength adequate range of motion within normal limits. Patient has good digital perfusion and is well oriented 3 with an incurvated third nail right medial border mild enlargement of the distal phalanx third digit right along with structural bunion deformity right over left with mild deviation of the hallux against the second toe     Assessment:     Ingrown toenail deformity third right medial border along with probable previous fracture of the digit and structural HAV deformity right over left    Plan:     H&P and x-rays reviewed with patient and today I discussed nail correction. I recommended just removing the corner and I explained the procedure to patient and what would be required. Patient wants surgery understanding risk and today I infiltrated the right third toe 60 mg Xylocaine Marcaine mixture remove the medial border exposed matrix and applied phenol 3 applications 30 seconds followed by alcohol lavage and sterile dressing. I then discussed her structural bunions and long-term correction which may be necessary right over left

## 2014-12-06 NOTE — Telephone Encounter (Signed)
Called patient at [phone] (cell #) to check to see how they were feeling from their ingrown toenail procedure that was done on 12/05/14.  Pt stated, "work up at 3 am and was in a lot of pain. Was awake from 3 am to 9 am". Pain is getting better. Patient did state, "they had not soaked their toe yet and was getting ready to do that now".

## 2015-02-03 ENCOUNTER — Ambulatory Visit (INDEPENDENT_AMBULATORY_CARE_PROVIDER_SITE_OTHER): Payer: 59 | Admitting: *Deleted

## 2015-02-03 DIAGNOSIS — Z23 Encounter for immunization: Secondary | ICD-10-CM | POA: Diagnosis not present

## 2015-02-27 ENCOUNTER — Telehealth: Payer: Self-pay | Admitting: *Deleted

## 2015-02-27 NOTE — Telephone Encounter (Signed)
"  I'd like to get some dates for surgery.  I know I have to come in to see him.  He x-rayed one foot but I'd like to get the other x-rayed too because it bothers me.  I know I want to have the worst one done first but I don't want to have both done at the same time.  When can he do it the last week of December?"  Yes, we can tentatively put you down for 05/02/2015.  Would you like me to send you to a scheduler?  "Yes, that would be great, thank you."

## 2015-03-02 ENCOUNTER — Ambulatory Visit (INDEPENDENT_AMBULATORY_CARE_PROVIDER_SITE_OTHER): Payer: 59 | Admitting: Podiatry

## 2015-03-02 ENCOUNTER — Encounter: Payer: Self-pay | Admitting: Podiatry

## 2015-03-02 VITALS — BP 98/64 | HR 68 | Resp 16

## 2015-03-02 DIAGNOSIS — M2041 Other hammer toe(s) (acquired), right foot: Secondary | ICD-10-CM | POA: Diagnosis not present

## 2015-03-02 DIAGNOSIS — M2010 Hallux valgus (acquired), unspecified foot: Secondary | ICD-10-CM

## 2015-03-02 NOTE — Patient Instructions (Signed)
Pre-Operative Instructions  Congratulations, you have decided to take an important step to improving your quality of life.  You can be assured that the doctors of [facility] will be with you every step of the way.  1. Plan to be at the surgery center/hospital at least 1 (one) hour prior to your scheduled time unless otherwise directed by the surgical center/hospital staff.  You must have a responsible adult accompany you, remain during the surgery and drive you home.  Make sure you have directions to the surgical center/hospital and know how to get there on time. 2. For hospital based surgery you will need to obtain a history and physical form from your family physician within 1 month prior to the date of surgery- we will give you a form for you primary physician.  3. We make every effort to accommodate the date you request for surgery.  There are however, times where surgery dates or times have to be moved.  We will contact you as soon as possible if a change in schedule is required.   4. No Aspirin/Ibuprofen for one week before surgery.  If you are on aspirin, any non-steroidal anti-inflammatory medications (Mobic, Aleve, Ibuprofen) you should stop taking it 7 days prior to your surgery.  You make take Tylenol  For pain prior to surgery.  5. Medications- If you are taking daily heart and blood pressure medications, seizure, reflux, allergy, asthma, anxiety, pain or diabetes medications, make sure the surgery center/hospital is aware before the day of surgery so they may notify you which medications to take or avoid the day of surgery. 6. No food or drink after midnight the night before surgery unless directed otherwise by surgical center/hospital staff. 7. No alcoholic beverages 24 hours prior to surgery.  No smoking 24 hours prior to or 24 hours after surgery. 8. Wear loose pants or shorts- loose enough to fit over bandages, boots, and casts. 9. No slip on shoes, sneakers are best. 10. Bring  your boot with you to the surgery center/hospital.  Also bring crutches or a walker if your physician has prescribed it for you.  If you do not have this equipment, it will be provided for you after surgery. 11. If you have not been contracted by the surgery center/hospital by the day before your surgery, call to confirm the date and time of your surgery. 12. Leave-time from work may vary depending on the type of surgery you have.  Appropriate arrangements should be made prior to surgery with your employer. 13. Prescriptions will be provided immediately following surgery by your doctor.  Have these filled as soon as possible after surgery and take the medication as directed. 14. Remove nail polish on the operative foot. 15. Wash the night before surgery.  The night before surgery wash the foot and leg well with the antibacterial soap provided and water paying special attention to beneath the toenails and in between the toes.  Rinse thoroughly with water and dry well with a towel.  Perform this wash unless told not to do so by your physician.  Enclosed: 1 Ice pack (please put in freezer the night before surgery)   1 Hibiclens skin cleaner   Pre-op Instructions  If you have any questions regarding the instructions, do not hesitate to call our office.  [place]: [address] [place], [place] [address] [phone]  [place]: [address] [place], [place] [address] [phone]  [place]: [address]  [place], [place] [address] [phone]  Dr. Richard   Tuchman DPM, Dr. Norman Regal DPM Dr. Richard Sikora DPM, Dr. M. Todd Hyatt DPM, Dr. Kathryn Egerton DPM 

## 2015-03-03 NOTE — Progress Notes (Signed)
Subjective:     Patient ID: Kelsey Mcconnell, female   DOB: 04/12/1961, 54 y.o.   MRN: [id number]  HPI patient presents stating I'm here to discuss getting my bunion fixed and my third toe is also really bothering me on my right foot. Patient states that she's been trying wider shoes she's tried soaks she's tried cushions and padding without relief of symptoms   Review of Systems     Objective:   Physical Exam Neurovascular status was found to be intact muscle strength adequate with hyperostosis medial aspect first metatarsal head right and dorsal spurring of the right first metatarsal. The third digit head of the middle phalanx is hypertrophied and it is irritated when pressed and makes it difficult for her to wear shoe gear with redness noted    Assessment:     Structural HAV with hallux limitus component of the right first MPJ along with digital arthritis of the interphalangeal joint right third toe    Plan:     H&P x-rays reviewed with patient and and discussed condition. I have recommended at this time correction of deformity due to long-standing nature and failure to respond to conservative care and I allowed her to read a consent form for alternative treatments complications associated with a biplanar-type Austin osteotomy with pin fixation along with distal arthroplasty digit 3 right. Patient reviewed all of this understanding everything as outlined and signs consent form and understands total recovery can be 6 months to one year for procedure such as this. Dispensed air fracture walker and gave preoperative instructions to patient and encouraged to call with questions prior to procedure. X-ray evaluation indicate elevation of the intermetatarsal angle between the first and second metatarsal of 14 and dorsal spurring of the first metatarsal with elongation of the segment. The middle phalanx right third toe is hypertrophied and painful

## 2015-04-25 ENCOUNTER — Telehealth: Payer: Self-pay | Admitting: *Deleted

## 2015-04-25 NOTE — Telephone Encounter (Signed)
Authorization was obtained for surgery scheduled for 05/02/2015 for Bi-planar Austin Bunionectomy and Hammer Toe Repair 3rd distal right foot.  Authorization number is [id number].  Authorization was faxed to Caren Griffins at [facility].

## 2015-04-27 ENCOUNTER — Telehealth: Payer: Self-pay | Admitting: *Deleted

## 2015-04-27 NOTE — Telephone Encounter (Signed)
"  I'm looking over my papers.  I don't see a prescription.  I wasn't sure if that's something that will be sent to my pharmacy prior to surgery, if he forgot to give it to me or if I'll get it day of surgery.  My daughter is scheduled to have surgery the same day as well.  Please give me a call."   I'm returning your call.  Dr. Paulla Dolly will give you a written prescription for pain medication after your surgery.  "Okay, thanks for returning my call."

## 2015-05-02 DIAGNOSIS — M2011 Hallux valgus (acquired), right foot: Secondary | ICD-10-CM | POA: Diagnosis not present

## 2015-05-02 DIAGNOSIS — M2041 Other hammer toe(s) (acquired), right foot: Secondary | ICD-10-CM | POA: Diagnosis not present

## 2015-05-11 ENCOUNTER — Other Ambulatory Visit: Payer: Self-pay | Admitting: Podiatry

## 2015-05-11 DIAGNOSIS — Z09 Encounter for follow-up examination after completed treatment for conditions other than malignant neoplasm: Secondary | ICD-10-CM

## 2015-05-11 DIAGNOSIS — M2041 Other hammer toe(s) (acquired), right foot: Secondary | ICD-10-CM

## 2015-05-11 DIAGNOSIS — M2010 Hallux valgus (acquired), unspecified foot: Secondary | ICD-10-CM

## 2015-05-12 ENCOUNTER — Ambulatory Visit (INDEPENDENT_AMBULATORY_CARE_PROVIDER_SITE_OTHER): Payer: 59

## 2015-05-12 ENCOUNTER — Ambulatory Visit (INDEPENDENT_AMBULATORY_CARE_PROVIDER_SITE_OTHER): Payer: 59 | Admitting: Podiatry

## 2015-05-12 VITALS — Temp 98.0°F

## 2015-05-12 DIAGNOSIS — M2041 Other hammer toe(s) (acquired), right foot: Secondary | ICD-10-CM | POA: Diagnosis not present

## 2015-05-12 DIAGNOSIS — M2010 Hallux valgus (acquired), unspecified foot: Secondary | ICD-10-CM | POA: Diagnosis not present

## 2015-05-12 DIAGNOSIS — Z09 Encounter for follow-up examination after completed treatment for conditions other than malignant neoplasm: Secondary | ICD-10-CM

## 2015-05-15 NOTE — Progress Notes (Signed)
Patient ID: LENNEA RUMFORD, female   DOB: 1961-02-21, 55 y.o.   MRN: [id number]  Subjective: Mosella RASHI LODES is a 55 y.o. is seen today in office s/p right biplanar Altamese Hankinson and 3rd digit hammertoe repair preformed on 05/02/15 with Dr. Paulla Dolly. They state their pain is minimal. She does continue with surgical boot. Denies any systemic complaints such as fevers, chills, nausea, vomiting. No calf pain, chest pain, shortness of breath.   Objective: General: No acute distress, AAOx3  DP/PT pulses palpable 2/4, CRT < 3 sec to all digits.  Protective sensation intact. Motor function intact.  Right foot: Incision is well coapted without any evidence of dehiscence. There is no surrounding erythema, ascending cellulitis, fluctuance, crepitus, malodor, drainage/purulence. There is mild edema around the surgical site. There is mild pain along the surgical site. Small amount of ecchymosis to the digits.  No other areas of tenderness to bilateral lower extremities.  No other open lesions or pre-ulcerative lesions.  No pain with calf compression, swelling, warmth, erythema.   Assessment and Plan:  Status post right foot surgery, doing well with no complications   -Treatment options discussed including all alternatives, risks, and complications -X-rays were obtained and reviewed with the patient. Hardware intact osteotomy present the first metatarsal. There is evidence of arthroplasty of the third toe. -Antibiotic ointment placed over the incision followed by a dressing. -Ice/elevation -Continue cam boot. -Pain medication as needed. -Monitor for any clinical signs or symptoms of infection and DVT/PE and directed to call the office immediately should any occur or go to the ER. -Follow-up in 1 week for suture removal or sooner if any problems arise. In the meantime, encouraged to call the office with any questions, concerns, change in symptoms.   Celesta Gentile, DPM

## 2015-05-18 ENCOUNTER — Encounter: Payer: Self-pay | Admitting: Podiatry

## 2015-05-18 ENCOUNTER — Ambulatory Visit (INDEPENDENT_AMBULATORY_CARE_PROVIDER_SITE_OTHER): Payer: 59 | Admitting: Podiatry

## 2015-05-18 ENCOUNTER — Ambulatory Visit (INDEPENDENT_AMBULATORY_CARE_PROVIDER_SITE_OTHER): Payer: 59

## 2015-05-18 DIAGNOSIS — M2041 Other hammer toe(s) (acquired), right foot: Secondary | ICD-10-CM | POA: Diagnosis not present

## 2015-05-18 DIAGNOSIS — Z09 Encounter for follow-up examination after completed treatment for conditions other than malignant neoplasm: Secondary | ICD-10-CM

## 2015-05-18 DIAGNOSIS — M2010 Hallux valgus (acquired), unspecified foot: Secondary | ICD-10-CM

## 2015-05-21 NOTE — Progress Notes (Signed)
Subjective:     Patient ID: Kelsey Mcconnell, female   DOB: 1961-03-19, 55 y.o.   MRN: [id number]  HPI patient states I'm doing very well with good alignment of the big toe joint and third toe with mild discomfort third toe if him to active   Review of Systems     Objective:   Physical Exam Neurovascular status intact wound edges well coapted with patient having good structural alignment first metatarsal and range of motion with 30 dorsiflexion 30 plantar flexion with no pain no crepitus within the joint and no structural deformity noted residual    Assessment:     Doing well post osteotomy first metatarsal right and digital correction third right    Plan:     Reviewed all x-rays with patient and at this time remove stitches and allow patient to gradually increase activity and return soft shoe gear in the next several weeks. Encouraged range of motion and reappoint to recheck again in 4 weeks or earlier if needed

## 2015-05-29 ENCOUNTER — Other Ambulatory Visit: Payer: 59 | Admitting: Podiatry

## 2015-05-31 ENCOUNTER — Ambulatory Visit: Payer: 59

## 2015-05-31 ENCOUNTER — Ambulatory Visit (INDEPENDENT_AMBULATORY_CARE_PROVIDER_SITE_OTHER): Payer: 59 | Admitting: Podiatry

## 2015-05-31 ENCOUNTER — Encounter: Payer: Self-pay | Admitting: Podiatry

## 2015-05-31 DIAGNOSIS — Z09 Encounter for follow-up examination after completed treatment for conditions other than malignant neoplasm: Secondary | ICD-10-CM

## 2015-05-31 DIAGNOSIS — M2041 Other hammer toe(s) (acquired), right foot: Secondary | ICD-10-CM

## 2015-05-31 DIAGNOSIS — M2010 Hallux valgus (acquired), unspecified foot: Secondary | ICD-10-CM

## 2015-05-31 NOTE — Progress Notes (Signed)
Subjective:     Patient ID: Kelsey Mcconnell, female   DOB: 10-Dec-1960, 55 y.o.   MRN: [id number]  HPI patient presents stating I just wanted to make sure that my motion was good   Review of Systems     Objective:   Physical Exam  Neurovascular status intact well-healed surgical site right even though she's been using cream on it which I do not want her using with good range of motion first MPJ with approximate 25 dorsiflexion 25 plantar flexion with no pain in the joint noted upon movement or crepitus. Third digit has mild swelling but is in good alignment and healing well    Assessment:     Overall doing well but I do think she is excessively anxious about condition and has been using creams against my advice    Plan:     Reviewed x-rays discussed the importance of not using creams and also discussed the importance of range of motion exercises. Patient will be seen back in 8 weeks or earlier if needed

## 2015-06-02 ENCOUNTER — Other Ambulatory Visit: Payer: 59

## 2015-07-19 ENCOUNTER — Ambulatory Visit (INDEPENDENT_AMBULATORY_CARE_PROVIDER_SITE_OTHER): Payer: 59

## 2015-07-19 ENCOUNTER — Ambulatory Visit (INDEPENDENT_AMBULATORY_CARE_PROVIDER_SITE_OTHER): Payer: 59 | Admitting: Podiatry

## 2015-07-19 ENCOUNTER — Ambulatory Visit: Payer: Self-pay

## 2015-07-19 DIAGNOSIS — Z09 Encounter for follow-up examination after completed treatment for conditions other than malignant neoplasm: Secondary | ICD-10-CM

## 2015-07-19 DIAGNOSIS — S93402A Sprain of unspecified ligament of left ankle, initial encounter: Secondary | ICD-10-CM

## 2015-07-19 DIAGNOSIS — M2041 Other hammer toe(s) (acquired), right foot: Secondary | ICD-10-CM | POA: Diagnosis not present

## 2015-07-19 DIAGNOSIS — M2010 Hallux valgus (acquired), unspecified foot: Secondary | ICD-10-CM

## 2015-07-19 DIAGNOSIS — M21612 Bunion of left foot: Secondary | ICD-10-CM

## 2015-07-19 NOTE — Progress Notes (Signed)
Subjective:     Patient ID: Kelsey Mcconnell, female   DOB: 04-27-1961, 55 y.o.   MRN: [id number]  HPI patient states my right foot is doing real well but the left one is starting to bother me more and also I have trouble on and off with my left ankle for a number of years   Review of Systems     Objective:   Physical Exam Vascular status intact muscle strength adequate with well-healed surgical site right first MPJ with wound edges well healed and excellent range of motion. The left first MPJ is starting to become compromised with discomfort on the lateral side of the joint surface and there is discomfort that comes on and off in the left ankle with history of x-rays of this    Assessment:     Doing well post osteotomy first metatarsal right with the beginnings of hallux limitus left and also possible bone spurs or osteochondral lesion of the left ankle    Plan:     H&P conditions reviewed and discussed continued range of motion for the first MPJ right and eventual correction of the left which she will probably not do for at least the next year. We will review her left ankle at next visit and I did discuss these at great length and her x-rays  Report indicates good healing of the osteotomy right first metatarsal with adequate shortness and on the left I did note that there is the beginnings of spurs with an E long gaited first metatarsal and narrowing of the joint surface

## 2015-07-20 ENCOUNTER — Telehealth: Payer: Self-pay | Admitting: Internal Medicine

## 2015-07-20 MED ORDER — OSELTAMIVIR PHOSPHATE 75 MG PO CAPS: 75.0000 mg | ORAL_CAPSULE | Freq: Every day | ORAL | Status: DC

## 2015-07-20 NOTE — Telephone Encounter (Signed)
I would go ahead and send in Tamiflu 75 mg po bid for 5 days but she needs to re-establish with Dr Regis Bill if Dr Mamie Nick is willing to see her.

## 2015-07-20 NOTE — Telephone Encounter (Signed)
Medication sent into the pharmacy. 

## 2015-07-20 NOTE — Telephone Encounter (Signed)
Pt brought her daughter in to see you, daughter diagnosed with the flu and given tami flu. Pt would like to know if you will send her in tami-flu as well.  However, pt has NOT seen Dr Regis Bill since 2012, therefore is considered a new pt. Advised pt of this, and I will send a message to Dr Regis Bill to ask her if she will continue to see her.  But pt states she thinks she is getting the flu as well.   [employer]

## 2015-07-20 NOTE — Telephone Encounter (Signed)
Pt brought her daughter in to see Dr Elease Hashimoto on 3/13 and daughter diagnosed with the flu. Pt wanted Dr Elease Hashimoto to call her in tami flu as well.  Pt has not seen Dr Regis Bill since 2012. Advised pt of this, and pt states she brings her daughter in all the time. Will you continue to see pt? If so, pt will need to re-establish. OK to schedule?

## 2015-07-24 NOTE — Telephone Encounter (Signed)
Prophylaxis not advised for  Average risk contacts    Per cdc guidleines  High risk would be young children and age 56 adn over  Or severe underlying illnesses .   We can see her as patient but  I dont recommend  Prophylaxis  In her situation at this time   . If gets a fever call   us.

## 2015-07-25 NOTE — Telephone Encounter (Signed)
Ok to schedule.

## 2015-07-27 ENCOUNTER — Other Ambulatory Visit: Payer: 59

## 2015-08-28 ENCOUNTER — Telehealth: Payer: Self-pay | Admitting: Internal Medicine

## 2015-08-28 NOTE — Telephone Encounter (Signed)
Pt has not seen Dr Regis Bill since 2012.  Pt has been having vertigo for about a week and wants to see Dr Regis Bill. Is it ok for dr Regis Bill to accept her back?  Pt was ok'd to have appointment back in March 2017, but never made an appointment. Ok to schedule?

## 2015-08-29 NOTE — Telephone Encounter (Signed)
Left message to call back  

## 2015-08-29 NOTE — Telephone Encounter (Signed)
?   Friday at 3 pm? ( a 30 minute appt)  otherwise   Will have to wait

## 2015-08-29 NOTE — Telephone Encounter (Signed)
Pt has been scheduled.  °

## 2015-09-01 ENCOUNTER — Ambulatory Visit (INDEPENDENT_AMBULATORY_CARE_PROVIDER_SITE_OTHER): Payer: 59 | Admitting: Internal Medicine

## 2015-09-01 ENCOUNTER — Encounter: Payer: Self-pay | Admitting: Internal Medicine

## 2015-09-01 VITALS — BP 110/68 | HR 68 | Temp 98.3°F | Wt 138.0 lb

## 2015-09-01 DIAGNOSIS — H8111 Benign paroxysmal vertigo, right ear: Secondary | ICD-10-CM | POA: Diagnosis not present

## 2015-09-01 DIAGNOSIS — Z79890 Hormone replacement therapy (postmenopausal): Secondary | ICD-10-CM

## 2015-09-01 DIAGNOSIS — R42 Dizziness and giddiness: Secondary | ICD-10-CM | POA: Diagnosis not present

## 2015-09-01 LAB — POCT URINALYSIS DIPSTICK
Bilirubin, UA: NEGATIVE
Blood, UA: NEGATIVE
Glucose, UA: NEGATIVE
Ketones, UA: NEGATIVE
Leukocytes, UA: NEGATIVE
Nitrite, UA: NEGATIVE
Protein, UA: NEGATIVE
Spec Grav, UA: 1.01
Urobilinogen, UA: 0.2
pH, UA: 6

## 2015-09-01 LAB — BASIC METABOLIC PANEL
BUN: 17 mg/dL (ref 6–23)
CO2: 27 mEq/L (ref 19–32)
Calcium: 9.4 mg/dL (ref 8.4–10.5)
Chloride: 102 mEq/L (ref 96–112)
Creatinine, Ser: 0.66 mg/dL (ref 0.40–1.20)
GFR: 98.47 mL/min (ref >60.00)
Glucose, Bld: 91 mg/dL (ref 70–99)
Potassium: 3.8 mEq/L (ref 3.5–5.1)
Sodium: 135 mEq/L (ref 135–145)

## 2015-09-01 LAB — LIPID PANEL
Cholesterol: 190 mg/dL (ref 0–200)
HDL: 63 mg/dL (ref >39.00)
LDL Cholesterol: 102 mg/dL — ABNORMAL HIGH (ref 0–99)
NonHDL: 127.11
Total CHOL/HDL Ratio: 3
Triglycerides: 127 mg/dL (ref 0.0–149.0)
VLDL: 25.4 mg/dL (ref 0.0–40.0)

## 2015-09-01 LAB — SEDIMENTATION RATE: Sed Rate: 7 mm/hr (ref 0–22)

## 2015-09-01 LAB — CBC WITH DIFFERENTIAL/PLATELET
Basophils Absolute: 0 10*3/uL (ref 0.0–0.1)
Basophils Relative: 0.7 % (ref 0.0–3.0)
Eosinophils Absolute: 0.2 10*3/uL (ref 0.0–0.7)
Eosinophils Relative: 3.3 % (ref 0.0–5.0)
HCT: 40 % (ref 36.0–46.0)
Hemoglobin: 14 g/dL (ref 12.0–15.0)
Lymphocytes Relative: 23.9 % (ref 12.0–46.0)
Lymphs Abs: 1.6 10*3/uL (ref 0.7–4.0)
MCHC: 35 g/dL (ref 30.0–36.0)
MCV: 88 fl (ref 78.0–100.0)
Monocytes Absolute: 0.5 10*3/uL (ref 0.1–1.0)
Monocytes Relative: 6.6 % (ref 3.0–12.0)
Neutro Abs: 4.5 10*3/uL (ref 1.4–7.7)
Neutrophils Relative %: 65.5 % (ref 43.0–77.0)
Platelets: 322 10*3/uL (ref 150.0–400.0)
RBC: 4.54 Mil/uL (ref 3.87–5.11)
RDW: 12.6 % (ref 11.5–15.5)
WBC: 6.9 10*3/uL (ref 4.0–10.5)

## 2015-09-01 LAB — HEPATIC FUNCTION PANEL
ALT: 27 U/L (ref 0–35)
AST: 21 U/L (ref 0–37)
Albumin: 4.3 g/dL (ref 3.5–5.2)
Alkaline Phosphatase: 50 U/L (ref 39–117)
Bilirubin, Direct: 0.1 mg/dL (ref 0.0–0.3)
Total Bilirubin: 0.4 mg/dL (ref 0.2–1.2)
Total Protein: 6.6 g/dL (ref 6.0–8.3)

## 2015-09-01 LAB — TSH: TSH: 1.93 u[IU]/mL (ref 0.35–4.50)

## 2015-09-01 LAB — T4, FREE: Free T4: 0.76 ng/dL (ref 0.60–1.60)

## 2015-09-01 NOTE — Progress Notes (Signed)
Chief Complaint  Patient presents with  . Dizziness    vertigo  reestablishihg    HPI: Patient  Kelsey Mcconnell  55 y.o. comes in today for  Problem based  Health Care visit  Reestablishing . Sees gyne yearly dr Matthew Saras but  Her as needed . Generally well only on  hrt no change recently   Onset  About  2 weeks ago after getting up from  Bed at [place] and nauseas  And then got better after a while but continues to have  Spinning  When Getting up  Or laying down and turning  To right  .  Lasts    A short wall  No falliing  Has always not had the best balance and  dosent like to close eyes   Wants to make sure not  Another problem suygar and hemoglobing. Although dc with vertigo in past and did the exercises    Maneuvers In past  On going allergies   None at the beach .   2 per day .  No head injry  Remote hx of such   And had ent  Evaluation   ? Hearing but no tinnitus or roaring  No neuro sx   Health Maintenance  Topic Date Due  . Hepatitis C Screening  1961/03/07  . HIV Screening  09/07/1974  . TETANUS/TDAP  09/07/1978  . MAMMOGRAM  06/22/2011  . INFLUENZA VACCINE  12/05/2015  . PAP SMEAR  11/09/2017  . COLONOSCOPY  07/08/2018    no tobacco 2 glasses of wine per night   ROS:  GEN/ HEENT: No fever, significant weight changes sweats headaches vision problems hearing changes, CV/ PULM; No chest pain shortness of breath cough, syncope,edema  change in exercise tolerance. GI /GU: No adominal pain, vomiting, change in bowel habits. No blood in the stool. No significant GU symptoms. SKIN/HEME: ,no acute skin rashes suspicious lesions or bleeding. No lymphadenopathy, nodules, masses.  NEURO/ PSYCH:  No neurologic signs such as weakness numbness. No depression anxiety. IMM/ Allergy: No unusual infections.  Allergy .   REST of 12 system review negative except as per HPI   Past Medical History  Diagnosis Date  . Acne   . Arthritis   . Miscarriage   . Chronic  constipation     ?  outlet  dysfunction  . Interstitial cystitis   . Allergy     Past Surgical History  Procedure Laterality Date  . Ablastion      uterine  . Cesarean section      x 3  . Hand surgery      rt  . Dilation and curettage of uterus    . Bladder surgery      ? dilitation or cytso  . Wisdom tooth extraction    . Colonoscopy      Family History  Problem Relation Age of Onset  . Arthritis Mother   . Fibromyalgia Mother   . Colon cancer Neg Hx   . Esophageal cancer Neg Hx   . Pancreatic cancer Neg Hx   . Rectal cancer Neg Hx   . Stomach cancer Neg Hx     Social History   Social History  . Marital Status: Married    Spouse Name: N/A  . Number of Children: 3  . Years of Education: N/A   Occupational History  . Stay at Home MOM    Social History Main Topics  . Smoking status: Former Research scientist (life sciences)  .  Smokeless tobacco: Never Used  . Alcohol Use: 8.4 oz/week    14 Glasses of wine per week  . Drug Use: No  . Sexual Activity: Not Asked   Other Topics Concern  . None   Social History Narrative   hh of 5    wine 2 per night    2 years of college    Married   Neg ets firearms wears seatbelts   G4 P3   Reg exercise    Outpatient Prescriptions Prior to Visit  Medication Sig Dispense Refill  . AMBULATORY NON FORMULARY MEDICATION Medication Name: glycerine suppository  Daily for chronic constipation    . ampicillin (PRINCIPEN) 500 MG capsule Take 500 mg by mouth daily.    Marland Kitchen oseltamivir (TAMIFLU) 75 MG capsule Take 1 capsule (75 mg total) by mouth daily. 10 capsule 0   No facility-administered medications prior to visit.     EXAM:  BP 110/68 mmHg  Pulse 68  Temp(Src) 98.3 F (36.8 C) (Oral)  Wt 138 lb (62.596 kg)  Body mass index is 21.61 kg/(m^2).  Physical Exam: Vital signs reviewed [id number] is a well-developed well-nourished alert cooperative    who appearsr stated age in no acute distress.  HEENT: normocephalic atraumatic , Eyes: PERRL  EOM's full, conjunctiva clear, Nares: paten,t no deformity discharge or tenderness., Ears: no deformity EAC's clear TMs with normal landmarks. Mouth: clear OP, no lesions, edema.  Moist mucous membranes. Dentition in adequate repair. NECK: supple without masses, thyromegaly or bruits. CHEST/PULM:  Clear to auscultation and percussion breath sounds equal no wheeze , rales or rhonchi. No chest wall deformities or tenderness. CV: PMI is nondisplaced, S1 S2 no gallops, murmurs, rubs. Peripheral pulses are full without delay.No JVD .  ABDOMEN: Bowel sounds normal nontender  No guard or rebound, no hepato splenomegal no CVA tenderness.   Extremtities:  No clubbing cyanosis or edema, no acute joint swelling or redness no focal atrophy NEURO:  Oriented x3, cranial nerves 3-12 appear to be intact, no obvious focal weakness,gait within normal limits no abnormal reflexes or asymmetrical  Some difficulty with heel to toe but steady gait neg rhomberg but  Cautions  Can lay down but hesitant to look to right  SKIN: No acute rashes normal turgor, color, no bruising or petechiae. PSYCH: Oriented, good eye contact, no obvious depression anxiety, cognition and judgment appear normal. LN: no cervicall adenopathy Wt Readings from Last 3 Encounters:  09/01/15 138 lb (62.596 kg)  06/24/14 139 lb 6.4 oz (63.231 kg)  06/07/14 140 lb (63.504 kg)   BP Readings from Last 3 Encounters:  09/01/15 110/68  03/02/15 98/64  12/05/14 113/65     Lab Results  Component Value Date   WBC 6.9 09/01/2015   HGB 14.0 09/01/2015   HCT 40.0 09/01/2015   PLT 322.0 09/01/2015   GLUCOSE 91 09/01/2015   CHOL 190 09/01/2015   TRIG 127.0 09/01/2015   HDL 63.00 09/01/2015   LDLCALC 102* 09/01/2015   ALT 27 09/01/2015   AST 21 09/01/2015   NA 135 09/01/2015   K 3.8 09/01/2015   CL 102 09/01/2015   CREATININE 0.66 09/01/2015   BUN 17 09/01/2015   CO2 27 09/01/2015   TSH 1.93 09/01/2015    ASSESSMENT AND PLAN:  Discussed  the following assessment and plan:  Benign paroxysmal positional vertigo, right - Plan: Basic metabolic panel, CBC with Differential/Platelet, Hepatic function panel, Lipid panel, TSH, T4, free, POCT urinalysis dipstick, Sedimentation rate  Dizziness and giddiness -  Plan: Basic metabolic panel, CBC with Differential/Platelet, Hepatic function panel, Lipid panel, TSH, T4, free, POCT urinalysis dipstick, Sedimentation rate  Postmenopausal HRT (hormone replacement therapy) - per dr Matthew Saras Most likely cause of sx not alarming however if  persistent or progressive consider other eval  wil check  Labs today r/o anemia thyroid issues etc  Is not fasting  Lunch at 12  Patient Care Team: Burnis Medin, MD as PCP - Vestavia Hills, MD (Dermatology) Patient Instructions   This sounds like  Positional vertigo and usually goes away  With The  time  Physical therapy or maneuvers .   Will notify you  of labs when available.   Benign Positional Vertigo Vertigo is the feeling that you or your surroundings are moving when they are not. Benign positional vertigo is the most common form of vertigo. The cause of this condition is not serious (is benign). This condition is triggered by certain movements and positions (is positional). This condition can be dangerous if it occurs while you are doing something that could endanger you or others, such as driving.  CAUSES In many cases, the cause of this condition is not known. It may be caused by a disturbance in an area of the inner ear that helps your brain to sense movement and balance. This disturbance can be caused by a viral infection (labyrinthitis), head injury, or repetitive motion. RISK FACTORS This condition is more likely to develop in:  Women.  People who are 24 years of age or older. SYMPTOMS Symptoms of this condition usually happen when you move your head or your eyes in different directions. Symptoms may start suddenly, and they usually last  for less than a minute. Symptoms may include:  Loss of balance and falling.  Feeling like you are spinning or moving.  Feeling like your surroundings are spinning or moving.  Nausea and vomiting.  Blurred vision.  Dizziness.  Involuntary eye movement (nystagmus). Symptoms can be mild and cause only slight annoyance, or they can be severe and interfere with daily life. Episodes of benign positional vertigo may return (recur) over time, and they may be triggered by certain movements. Symptoms may improve over time. DIAGNOSIS This condition is usually diagnosed by medical history and a physical exam of the head, neck, and ears. You may be referred to a health care provider who specializes in ear, nose, and throat (ENT) problems (otolaryngologist) or a provider who specializes in disorders of the nervous system (neurologist). You may have additional testing, including:  MRI.  A CT scan.  Eye movement tests. Your health care provider may ask you to change positions quickly while he or she watches you for symptoms of benign positional vertigo, such as nystagmus. Eye movement may be tested with an electronystagmogram (ENG), caloric stimulation, the Dix-Hallpike test, or the roll test.  An electroencephalogram (EEG). This records electrical activity in your brain.  Hearing tests. TREATMENT Usually, your health care provider will treat this by moving your head in specific positions to adjust your inner ear back to normal. Surgery may be needed in severe cases, but this is rare. In some cases, benign positional vertigo may resolve on its own in 2-4 weeks. HOME CARE INSTRUCTIONS Safety  Move slowly.Avoid sudden body or head movements.  Avoid driving.  Avoid operating heavy machinery.  Avoid doing any tasks that would be dangerous to you or others if a vertigo episode would occur.  If you have trouble walking or keeping your balance, try using  a cane for stability. If you feel dizzy or  unstable, sit down right away.  Return to your normal activities as told by your health care provider. Ask your health care provider what activities are safe for you. General Instructions  Take over-the-counter and prescription medicines only as told by your health care provider.  Avoid certain positions or movements as told by your health care provider.  Drink enough fluid to keep your urine clear or pale yellow.  Keep all follow-up visits as told by your health care provider. This is important. SEEK MEDICAL CARE IF:  You have a fever.  Your condition gets worse or you develop new symptoms.  Your family or friends notice any behavioral changes.  Your nausea or vomiting gets worse.  You have numbness or a "pins and needles" sensation. SEEK IMMEDIATE MEDICAL CARE IF:  You have difficulty speaking or moving.  You are always dizzy.  You faint.  You develop severe headaches.  You have weakness in your legs or arms.  You have changes in your hearing or vision.  You develop a stiff neck.  You develop sensitivity to light.   This information is not intended to replace advice given to you by your health care provider. Make sure you discuss any questions you have with your health care provider.   Document Released: 01/28/2006 Document Revised: 01/11/2015 Document Reviewed: 08/15/2014 Elsevier Interactive Patient Education 2016 [place]. Courney Garrod M.D.

## 2015-09-01 NOTE — Progress Notes (Signed)
Pre visit review using our clinic review tool, if applicable. No additional management support is needed unless otherwise documented below in the visit note. 

## 2015-09-01 NOTE — Patient Instructions (Signed)
This sounds like  Positional vertigo and usually goes away  With The  time  Physical therapy or maneuvers .   Will notify you  of labs when available.   Benign Positional Vertigo Vertigo is the feeling that you or your surroundings are moving when they are not. Benign positional vertigo is the most common form of vertigo. The cause of this condition is not serious (is benign). This condition is triggered by certain movements and positions (is positional). This condition can be dangerous if it occurs while you are doing something that could endanger you or others, such as driving.  CAUSES In many cases, the cause of this condition is not known. It may be caused by a disturbance in an area of the inner ear that helps your brain to sense movement and balance. This disturbance can be caused by a viral infection (labyrinthitis), head injury, or repetitive motion. RISK FACTORS This condition is more likely to develop in:  Women.  People who are 22 years of age or older. SYMPTOMS Symptoms of this condition usually happen when you move your head or your eyes in different directions. Symptoms may start suddenly, and they usually last for less than a minute. Symptoms may include:  Loss of balance and falling.  Feeling like you are spinning or moving.  Feeling like your surroundings are spinning or moving.  Nausea and vomiting.  Blurred vision.  Dizziness.  Involuntary eye movement (nystagmus). Symptoms can be mild and cause only slight annoyance, or they can be severe and interfere with daily life. Episodes of benign positional vertigo may return (recur) over time, and they may be triggered by certain movements. Symptoms may improve over time. DIAGNOSIS This condition is usually diagnosed by medical history and a physical exam of the head, neck, and ears. You may be referred to a health care provider who specializes in ear, nose, and throat (ENT) problems (otolaryngologist) or a provider who  specializes in disorders of the nervous system (neurologist). You may have additional testing, including:  MRI.  A CT scan.  Eye movement tests. Your health care provider may ask you to change positions quickly while he or she watches you for symptoms of benign positional vertigo, such as nystagmus. Eye movement may be tested with an electronystagmogram (ENG), caloric stimulation, the Dix-Hallpike test, or the roll test.  An electroencephalogram (EEG). This records electrical activity in your brain.  Hearing tests. TREATMENT Usually, your health care provider will treat this by moving your head in specific positions to adjust your inner ear back to normal. Surgery may be needed in severe cases, but this is rare. In some cases, benign positional vertigo may resolve on its own in 2-4 weeks. HOME CARE INSTRUCTIONS Safety  Move slowly.Avoid sudden body or head movements.  Avoid driving.  Avoid operating heavy machinery.  Avoid doing any tasks that would be dangerous to you or others if a vertigo episode would occur.  If you have trouble walking or keeping your balance, try using a cane for stability. If you feel dizzy or unstable, sit down right away.  Return to your normal activities as told by your health care provider. Ask your health care provider what activities are safe for you. General Instructions  Take over-the-counter and prescription medicines only as told by your health care provider.  Avoid certain positions or movements as told by your health care provider.  Drink enough fluid to keep your urine clear or pale yellow.  Keep all follow-up visits as  told by your health care provider. This is important. SEEK MEDICAL CARE IF:  You have a fever.  Your condition gets worse or you develop new symptoms.  Your family or friends notice any behavioral changes.  Your nausea or vomiting gets worse.  You have numbness or a "pins and needles" sensation. SEEK IMMEDIATE  MEDICAL CARE IF:  You have difficulty speaking or moving.  You are always dizzy.  You faint.  You develop severe headaches.  You have weakness in your legs or arms.  You have changes in your hearing or vision.  You develop a stiff neck.  You develop sensitivity to light.   This information is not intended to replace advice given to you by your health care provider. Make sure you discuss any questions you have with your health care provider.   Document Released: 01/28/2006 Document Revised: 01/11/2015 Document Reviewed: 08/15/2014 Elsevier Interactive Patient Education [employer].

## 2015-10-13 ENCOUNTER — Ambulatory Visit (INDEPENDENT_AMBULATORY_CARE_PROVIDER_SITE_OTHER): Payer: 59 | Admitting: Podiatry

## 2015-10-13 ENCOUNTER — Encounter: Payer: Self-pay | Admitting: Podiatry

## 2015-10-13 ENCOUNTER — Ambulatory Visit (INDEPENDENT_AMBULATORY_CARE_PROVIDER_SITE_OTHER): Payer: 59

## 2015-10-13 VITALS — BP 112/67 | HR 78 | Resp 16

## 2015-10-13 DIAGNOSIS — M2011 Hallux valgus (acquired), right foot: Secondary | ICD-10-CM

## 2015-10-13 DIAGNOSIS — Z9889 Other specified postprocedural states: Secondary | ICD-10-CM

## 2015-10-13 NOTE — Progress Notes (Signed)
Subjective:     Patient ID: Kelsey Mcconnell, female   DOB: [birth year], 55 y.o.   MRN: [id number]  HPI patient states I'm doing well with minimal discomfort and able to walk distances and I know some day I need to get the left one fixed   Review of Systems     Objective:   Physical Exam Neurovascular status intact muscle strength adequate with good alignment of the first MPJ with range of motion that's adequate with no indication of crepitus and left structural deformity noted    Assessment:     Doing well post osteotomy first metatarsal right with joint space that's open and good motion and structural deformity of hallux limitus left    Plan:     H&P and x-ray of right reviewed. At this time discharge from right and left will be done as patient needs it to be done  X-ray report indicates the osteotomy is healed well pins are in place with good alignment

## 2015-10-20 ENCOUNTER — Ambulatory Visit: Payer: 59 | Admitting: Podiatry

## 2015-11-29 ENCOUNTER — Encounter: Payer: Self-pay | Admitting: Podiatry

## 2015-12-25 ENCOUNTER — Ambulatory Visit (INDEPENDENT_AMBULATORY_CARE_PROVIDER_SITE_OTHER): Payer: 59

## 2015-12-25 ENCOUNTER — Encounter: Payer: Self-pay | Admitting: Podiatry

## 2015-12-25 ENCOUNTER — Ambulatory Visit (INDEPENDENT_AMBULATORY_CARE_PROVIDER_SITE_OTHER): Payer: 59 | Admitting: Podiatry

## 2015-12-25 VITALS — BP 107/72 | HR 78 | Resp 16

## 2015-12-25 DIAGNOSIS — M779 Enthesopathy, unspecified: Secondary | ICD-10-CM

## 2015-12-25 DIAGNOSIS — Z9889 Other specified postprocedural states: Secondary | ICD-10-CM

## 2015-12-25 NOTE — Progress Notes (Signed)
Subjective:     Patient ID: Kelsey Mcconnell, female   DOB: [birth year], 55 y.o.   MRN: [id number]  HPI patient presents stating that she has been getting some sharp pain in her right foot and she wanted to make sure everything was okay   Review of Systems     Objective:   Physical Exam Neurovascular status intact muscle strength adequate with minimal discomfort in the first MPJ right with pain slightly if I move the joint but no area that I could palpate and create intense discomfort    Assessment:     Cannot rule out inflammatory issue versus some kind of soft tissue issue versus something related to hallux limitus    Plan:     H&P and x-ray reviewed and today I scanned for custom orthotics with Morton's extension to take pressure off the joint surfaces. Patient will receive these when they're ready and may require other treatment if symptoms continue to persist  X-ray report indicates that everything appears to be doing well and there is no indications of pathology

## 2016-01-16 ENCOUNTER — Ambulatory Visit: Payer: 59 | Admitting: *Deleted

## 2016-01-16 DIAGNOSIS — M79672 Pain in left foot: Secondary | ICD-10-CM

## 2016-01-16 DIAGNOSIS — M79671 Pain in right foot: Secondary | ICD-10-CM

## 2016-01-16 NOTE — Progress Notes (Signed)
Patient ID: Kelsey Mcconnell, female   DOB: 12-16-1960, 55 y.o.   MRN: [id number]  Patient presents for orthotic pick up.  Verbal and written break in and wear instructions given.  Patient will follow up in 4 weeks if symptoms worsen or fail to improve.

## 2016-01-16 NOTE — Patient Instructions (Signed)

## 2016-02-21 ENCOUNTER — Ambulatory Visit (INDEPENDENT_AMBULATORY_CARE_PROVIDER_SITE_OTHER): Payer: 59 | Admitting: Family Medicine

## 2016-02-21 DIAGNOSIS — Z23 Encounter for immunization: Secondary | ICD-10-CM | POA: Diagnosis not present

## 2016-06-18 DIAGNOSIS — N3010 Interstitial cystitis (chronic) without hematuria: Secondary | ICD-10-CM | POA: Diagnosis not present

## 2016-06-18 DIAGNOSIS — R350 Frequency of micturition: Secondary | ICD-10-CM | POA: Diagnosis not present

## 2016-06-18 DIAGNOSIS — N393 Stress incontinence (female) (male): Secondary | ICD-10-CM | POA: Diagnosis not present

## 2016-06-25 DIAGNOSIS — L570 Actinic keratosis: Secondary | ICD-10-CM | POA: Diagnosis not present

## 2016-07-16 DIAGNOSIS — L570 Actinic keratosis: Secondary | ICD-10-CM | POA: Diagnosis not present

## 2016-07-16 DIAGNOSIS — L821 Other seborrheic keratosis: Secondary | ICD-10-CM | POA: Diagnosis not present

## 2016-07-16 DIAGNOSIS — D225 Melanocytic nevi of trunk: Secondary | ICD-10-CM | POA: Diagnosis not present

## 2016-07-16 DIAGNOSIS — L719 Rosacea, unspecified: Secondary | ICD-10-CM | POA: Diagnosis not present

## 2016-10-01 ENCOUNTER — Encounter: Payer: Self-pay | Admitting: Sports Medicine

## 2016-10-01 ENCOUNTER — Ambulatory Visit (INDEPENDENT_AMBULATORY_CARE_PROVIDER_SITE_OTHER): Payer: BLUE CROSS/BLUE SHIELD

## 2016-10-01 ENCOUNTER — Ambulatory Visit (INDEPENDENT_AMBULATORY_CARE_PROVIDER_SITE_OTHER): Payer: BLUE CROSS/BLUE SHIELD | Admitting: Sports Medicine

## 2016-10-01 VITALS — BP 112/67

## 2016-10-01 DIAGNOSIS — M79672 Pain in left foot: Secondary | ICD-10-CM

## 2016-10-01 DIAGNOSIS — S99922A Unspecified injury of left foot, initial encounter: Secondary | ICD-10-CM

## 2016-10-01 DIAGNOSIS — S90122A Contusion of left lesser toe(s) without damage to nail, initial encounter: Secondary | ICD-10-CM | POA: Diagnosis not present

## 2016-10-01 MED ORDER — AMOXICILLIN-POT CLAVULANATE 875-125 MG PO TABS: 1.0000 | ORAL_TABLET | Freq: Two times a day (BID) | ORAL | 0 refills | Status: DC

## 2016-10-01 NOTE — Progress Notes (Signed)
Subjective: Kelsey Mcconnell is a 56 y.o. female patient presents to office today complaining of a painful left 1st toenail x 6 days after dropping a concrete block on her toe. Patient has treated this by soaking and taking aleve. Patient denies fever/chills/nausea/vomitting/any other related constitutional symptoms at this time.  Patient Active Problem List   Diagnosis Date Noted  . Cystitis, interstitial 06/15/2014  . Personal history of colonic polyps 09/03/2013  . Constipation 04/11/2011  . Acne     Current Outpatient Prescriptions on File Prior to Visit  Medication Sig Dispense Refill  . AMBULATORY NON FORMULARY MEDICATION Medication Name: glycerine suppository  Daily for chronic constipation    . ampicillin (PRINCIPEN) 500 MG capsule Take 500 mg by mouth daily.    Marland Kitchen MINIVELLE 0.0375 MG/24HR 1 patch. Twice weekly    . progesterone (PROMETRIUM) 100 MG capsule Take 1 capsule by mouth daily.     No current facility-administered medications on file prior to visit.     Allergies  Allergen Reactions  . Codeine Nausea And Vomiting    Objective:  There were no vitals filed for this visit.  General: Well developed, nourished, in no acute distress, alert and oriented x3   Dermatology: Skin is warm, dry and supple bilateral. Left hallux nail is partially attached with significant hematoma at proximal nail fold. (-) Erythema. (+) Edema. (-) serosanguous drainage present. The remaining nails appear unremarkable at this time. There are no open sores, lesions or other signs of infection  present.  Vascular: Dorsalis Pedis artery and Posterior Tibial artery pedal pulses are 2/4 bilateral with immedate capillary fill time. Pedal hair growth present. No lower extremity edema.   Neruologic: Grossly intact via light touch bilateral.  Musculoskeletal: Tenderness to palpation of the Left hallux proximal nail fold. Muscular strength within normal limits in all groups bilateral.   Assesement  and Plan: Problem List Items Addressed This Visit    None    Visit Diagnoses    Toe hematoma, left, initial encounter    -  Primary   Left foot pain       Relevant Orders   DG Foot Complete Left   Toe injury, left, initial encounter         -Patient examined -Xrays reviewed; No acute findings  -Discussed treatment alternatives and plan of care; Explained temporary nail avulsion and post procedure course to patient. - After a verbal consent, injected 3 ml of a 50:50 mixture of 2% plain  lidocaine and 0.5% plain marcaine in a normal hallux block fashion. Next, a  betadine prep was performed. Anesthesia was tested and found to be appropriate.  The Left hallux nail was then incised from the hyponychium to the epinychium. The nail was removed and cleared from the field and then flushed with alcohol and dressed with antibiotic cream and a dry sterile dressing. -Patient was instructed to leave the dressing intact for today and begin soaking  in a weak solution of betadine or Epsom salt and water tomorrow. Patient was instructed to  soak for 15 minutes each day and apply neosporin and a gauze or bandaid dressing each day. -Patient was instructed to monitor the toe for signs of infection and return to office if toe becomes red, hot or swollen. -Rx Augmentin for preventive measures -Advised ice, elevation, and tylenol or motrin if needed for pain.  -Patient is to return in 2 weeks for follow up care/nail check or sooner if problems arise.  Landis Martins, DPM

## 2016-10-01 NOTE — Patient Instructions (Signed)

## 2016-10-15 ENCOUNTER — Encounter: Payer: Self-pay | Admitting: Sports Medicine

## 2016-10-15 ENCOUNTER — Ambulatory Visit (INDEPENDENT_AMBULATORY_CARE_PROVIDER_SITE_OTHER): Payer: BLUE CROSS/BLUE SHIELD | Admitting: Sports Medicine

## 2016-10-15 VITALS — BP 108/77 | HR 89 | Resp 16

## 2016-10-15 DIAGNOSIS — Z9889 Other specified postprocedural states: Secondary | ICD-10-CM

## 2016-10-15 DIAGNOSIS — M79672 Pain in left foot: Secondary | ICD-10-CM

## 2016-10-15 DIAGNOSIS — S90122D Contusion of left lesser toe(s) without damage to nail, subsequent encounter: Secondary | ICD-10-CM

## 2016-10-15 NOTE — Progress Notes (Signed)
  Subjective: Kelsey Mcconnell is a 56 y.o. female patient returns to office today for follow up evaluation after having Left Hallux total temporary nail avulsion performed on 10-01-16. Patient has been soaking using epsom salt and applying topical antibiotic covered with bandaid daily. Patient states that soreness is better, not in any pain; denies fever/chills/nausea/vomitting/any other related constitutional symptoms at this time.  Patient Active Problem List   Diagnosis Date Noted  . Cystitis, interstitial 06/15/2014  . Personal history of colonic polyps 09/03/2013  . Constipation 04/11/2011  . Acne     Current Outpatient Prescriptions on File Prior to Visit  Medication Sig Dispense Refill  . amoxicillin-clavulanate (AUGMENTIN) 875-125 MG tablet Take 1 tablet by mouth 2 (two) times daily. 28 tablet 0   No current facility-administered medications on file prior to visit.     Allergies  Allergen Reactions  . Codeine Nausea And Vomiting    Objective:  General: Well developed, nourished, in no acute distress, alert and oriented x3   Dermatology: Skin is warm, dry and supple bilateral. Left hallux nail bed appears to be clean, dry, with mild granular tissue and surrounding eschar/scab. (-) Erythema. (-) Edema. (-) serosanguous drainage present. The remaining nails appear unremarkable at this time. There are no other lesions or other signs of infection  present.  Neurovascular status: Intact. No lower extremity swelling; No pain with calf compression bilateral.  Musculoskeletal: Decreased tenderness to palpation of the Left hallux. Muscular strength within normal limits bilateral.   Assesement and Plan: Problem List Items Addressed This Visit    None    Visit Diagnoses    S/P nail surgery    -  Primary   Left foot pain       Toe hematoma, left, subsequent encounter       improved      -Examined patient  -Cleansed left hallux and gently scrubbed with peroxide and  q-tip/curetted away eschar at site and applied antibiotic cream covered with bandaid.  -Discussed plan of care with patient. -Patient to continue soaking in a weak solution of Epsom salt and warm water x 1 week. Patient was instructed to soak for 15-20 minutes each day until the toe appears normal and there is no drainage, redness, tenderness, or swelling at the procedure site, and apply neosporin and a gauze or bandaid dressing each day as needed. May leave open to air at night. -Educated patient on long term care after nail surgery. -Patient was instructed to monitor the toe for reoccurrence and signs of infection; Patient advised to return to office or go to ER if toe becomes red, hot or swollen. -Patient is to return as needed or sooner if problems arise.  Landis Martins, DPM

## 2017-01-09 DIAGNOSIS — N3010 Interstitial cystitis (chronic) without hematuria: Secondary | ICD-10-CM | POA: Diagnosis not present

## 2017-01-09 DIAGNOSIS — R350 Frequency of micturition: Secondary | ICD-10-CM | POA: Diagnosis not present

## 2017-01-22 DIAGNOSIS — N3010 Interstitial cystitis (chronic) without hematuria: Secondary | ICD-10-CM | POA: Diagnosis not present

## 2017-01-24 ENCOUNTER — Encounter: Payer: Self-pay | Admitting: Internal Medicine

## 2017-01-29 DIAGNOSIS — L570 Actinic keratosis: Secondary | ICD-10-CM | POA: Diagnosis not present

## 2017-02-06 DIAGNOSIS — N3010 Interstitial cystitis (chronic) without hematuria: Secondary | ICD-10-CM | POA: Diagnosis not present

## 2017-02-06 DIAGNOSIS — R350 Frequency of micturition: Secondary | ICD-10-CM | POA: Diagnosis not present

## 2017-03-06 DIAGNOSIS — Z6821 Body mass index (BMI) 21.0-21.9, adult: Secondary | ICD-10-CM | POA: Diagnosis not present

## 2017-03-06 DIAGNOSIS — Z1231 Encounter for screening mammogram for malignant neoplasm of breast: Secondary | ICD-10-CM | POA: Diagnosis not present

## 2017-03-06 DIAGNOSIS — Z01419 Encounter for gynecological examination (general) (routine) without abnormal findings: Secondary | ICD-10-CM | POA: Diagnosis not present

## 2017-03-14 ENCOUNTER — Telehealth: Payer: Self-pay | Admitting: *Deleted

## 2017-03-14 NOTE — Telephone Encounter (Signed)
"  I'm calling about a possible surgery with Dr. Paulla Dolly on my left foot."

## 2017-03-19 NOTE — Telephone Encounter (Signed)
"  I'm a patient of Dr. Paulla Dolly.  I want to see about scheduling a foot surgery.  Call me back please."

## 2017-03-20 NOTE — Telephone Encounter (Signed)
I am returning your call.  "I wanted to see if Dr. Paulla Dolly has any time available to do my surgery at the end of December."  Dr. Paulla Dolly can do it on December 18.  "That's the latest date he can do it?"  Yes, He will not be in the office the week of December 24.  You will need an appointment to come in to see Dr. Paulla Dolly.  "I just saw him recently, it may be about a year but nothing has changed."  You will need to see him for a consultation because you have to sign consent forms.  Can you hold me a spot for December 18.  I can put you down tentatively for December 18 but that doesn't guarantee you a spot.  You will need to come in to see him as soon as possible to retain the appointment for December 18.  "Can you schedule me an appointment?"  Let me transfer you to a scheduler.

## 2017-03-24 ENCOUNTER — Ambulatory Visit (INDEPENDENT_AMBULATORY_CARE_PROVIDER_SITE_OTHER): Payer: BLUE CROSS/BLUE SHIELD | Admitting: Podiatry

## 2017-03-24 ENCOUNTER — Ambulatory Visit: Payer: Self-pay

## 2017-03-24 ENCOUNTER — Encounter: Payer: Self-pay | Admitting: Podiatry

## 2017-03-24 ENCOUNTER — Ambulatory Visit (INDEPENDENT_AMBULATORY_CARE_PROVIDER_SITE_OTHER): Payer: BLUE CROSS/BLUE SHIELD

## 2017-03-24 ENCOUNTER — Other Ambulatory Visit: Payer: Self-pay | Admitting: Podiatry

## 2017-03-24 DIAGNOSIS — M79671 Pain in right foot: Secondary | ICD-10-CM | POA: Diagnosis not present

## 2017-03-24 DIAGNOSIS — M779 Enthesopathy, unspecified: Secondary | ICD-10-CM

## 2017-03-24 DIAGNOSIS — M2022 Hallux rigidus, left foot: Secondary | ICD-10-CM

## 2017-03-24 NOTE — Patient Instructions (Signed)
Pre-Operative Instructions  Congratulations, you have decided to take an important step towards improving your quality of life.  You can be assured that the doctors and staff at [facility] will be with you every step of the way.  Here are some important things you should know:  1. Plan to be at the surgery center/hospital at least 1 (one) hour prior to your scheduled time, unless otherwise directed by the surgical center/hospital staff.  You must have a responsible adult accompany you, remain during the surgery and drive you home.  Make sure you have directions to the surgical center/hospital to ensure you arrive on time. 2. If you are having surgery at [facility] or  hospitals, you will need a copy of your medical history and physical form from your family physician within one month prior to the date of surgery. We will give you a form for your primary physician to complete.  3. We make every effort to accommodate the date you request for surgery.  However, there are times where surgery dates or times have to be moved.  We will contact you as soon as possible if a change in schedule is required.   4. No aspirin/ibuprofen for one week before surgery.  If you are on aspirin, any non-steroidal anti-inflammatory medications (Mobic, Aleve, Ibuprofen) should not be taken seven (7) days prior to your surgery.  You make take Tylenol for pain prior to surgery.  5. Medications - If you are taking daily heart and blood pressure medications, seizure, reflux, allergy, asthma, anxiety, pain or diabetes medications, make sure you notify the surgery center/hospital before the day of surgery so they can tell you which medications you should take or avoid the day of surgery. 6. No food or drink after midnight the night before surgery unless directed otherwise by surgical center/hospital staff. 7. No alcoholic beverages 24-hours prior to surgery.  No smoking 24-hours prior or 24-hours after  surgery. 8. Wear loose pants or shorts. They should be loose enough to fit over bandages, boots, and casts. 9. Don't wear slip-on shoes. Sneakers are preferred. 10. Bring your boot with you to the surgery center/hospital.  Also bring crutches or a walker if your physician has prescribed it for you.  If you do not have this equipment, it will be provided for you after surgery. 11. If you have not been contacted by the surgery center/hospital by the day before your surgery, call to confirm the date and time of your surgery. 12. Leave-time from work may vary depending on the type of surgery you have.  Appropriate arrangements should be made prior to surgery with your employer. 13. Prescriptions will be provided immediately following surgery by your doctor.  Fill these as soon as possible after surgery and take the medication as directed. Pain medications will not be refilled on weekends and must be approved by the doctor. 14. Remove nail polish on the operative foot and avoid getting pedicures prior to surgery. 15. Wash the night before surgery.  The night before surgery wash the foot and leg well with water and the antibacterial soap provided. Be sure to pay special attention to beneath the toenails and in between the toes.  Wash for at least three (3) minutes. Rinse thoroughly with water and dry well with a towel.  Perform this wash unless told not to do so by your physician.  Enclosed: 1 Ice pack (please put in freezer the night before surgery)   1 Hibiclens skin cleaner     Pre-op instructions  If you have any questions regarding the instructions, please do not hesitate to call our office.  [place]: [address], [place], [place] [address] -- [phone]  [place]: [address] [place], [place] [address] -- [phone]  [place]: [address]  , [place] [address] -- [phone]  [place]: [address], [address], [place], [place] [address] -- [phone]  Website:  [email or web address] 

## 2017-03-28 NOTE — Progress Notes (Signed)
Subjective:    Patient ID: Kelsey Mcconnell, female   DOB: 56 y.o.   MRN: [id number]   HPI patient presents stating she wants to get the left foot fixed and states the right one has done very well and the pain has been quite a bit more active and the left over the last 6 months    ROS      Objective:  Physical Exam neurovascular status intact negative Homan sign was noted with patient's left first metatarsal showing inflammation pain around the first MPJ with moderate range of motion loss     Assessment:   Inflammatory capsulitis with inflammation of the first MPJ left with pain      Plan:    H&P condition reviewed and recommended correction. Allow patient to read consent form going over alternative treatments complications and everything as outlined in the consent form and patient understands risk and signs consent form. Understands ultimately this may require fusion or joint implantation procedure and that total recovery is 6 months to one year. Air fracture walker dispensed and surgery is scheduled with encouragement for any questions to call the office  X-rays indicating elongation first metatarsal segment with dorsal spurring and narrowing of the joint surface. The right has been shortened with pin fixation in place and good alignment

## 2017-04-22 ENCOUNTER — Encounter: Payer: Self-pay | Admitting: Podiatry

## 2017-04-22 DIAGNOSIS — M1990 Unspecified osteoarthritis, unspecified site: Secondary | ICD-10-CM | POA: Diagnosis not present

## 2017-04-22 DIAGNOSIS — M2012 Hallux valgus (acquired), left foot: Secondary | ICD-10-CM | POA: Diagnosis not present

## 2017-04-22 DIAGNOSIS — M2022 Hallux rigidus, left foot: Secondary | ICD-10-CM | POA: Diagnosis not present

## 2017-04-24 ENCOUNTER — Telehealth: Payer: Self-pay | Admitting: Podiatry

## 2017-04-24 NOTE — Telephone Encounter (Signed)
I'm a pt of Dr. Mellody Drown and I had surgery on Tuesday. I need to speak to the nurse about my bandage. I never received a call from the surgical center the next day so I didn't get a chance to talk to anybody about it. The first day there was some bleeding and so the bandage is kind of yucky with dried up blood now. I don't know if it needs to be changed or not before the 26 th. So just let me know what I should do. Thank you. Bye.

## 2017-04-24 NOTE — Telephone Encounter (Signed)
I informed pt that it was not unusual to have post op bleeding, then areas of dried blood, and it was sterile and provided a great splint, and protection. Pt states understanding.

## 2017-04-30 ENCOUNTER — Ambulatory Visit (INDEPENDENT_AMBULATORY_CARE_PROVIDER_SITE_OTHER): Payer: BLUE CROSS/BLUE SHIELD

## 2017-04-30 ENCOUNTER — Ambulatory Visit (INDEPENDENT_AMBULATORY_CARE_PROVIDER_SITE_OTHER): Payer: BLUE CROSS/BLUE SHIELD | Admitting: Podiatry

## 2017-04-30 DIAGNOSIS — M2022 Hallux rigidus, left foot: Secondary | ICD-10-CM

## 2017-04-30 DIAGNOSIS — Z9889 Other specified postprocedural states: Secondary | ICD-10-CM

## 2017-05-03 NOTE — Progress Notes (Signed)
  Subjective:  Patient ID: Kelsey Mcconnell, female    DOB: May 18, 1960,  MRN: [id number]  No chief complaint on file.  DOS: 04/22/17 Procedure: [place] Corporal Bunionectomy (Dr. Paulla Dolly)  56 y.o. female returns for post-op check. Denies N/V/F/Ch. Pain is controlled with current medications.  Denies postoperative issues thus far.  Objective:   General AA&O x3. Normal mood and affect.  Vascular Foot warm and well perfused.  Neurologic Gross sensation intact.  Dermatologic Skin healing well without signs of infection. Skin edges well coapted with intact dissolvable suture material.  Orthopedic: Slight tenderness to palpation noted about the surgical site.    Assessment & Plan:  Patient was evaluated and treated and all questions answered.  S/p left biplanar [place] bunionectomy -Progressing as expected post-operatively. -X-rays reviewed successful bunionectomy of the left foot with intact pin fixation -Sutures: Left intact. -Medications refilled: None -Foot redressed.  Return in about 1 week (around 05/07/2017) for Post-op.

## 2017-05-08 ENCOUNTER — Encounter: Payer: Self-pay | Admitting: Podiatry

## 2017-05-08 ENCOUNTER — Ambulatory Visit (INDEPENDENT_AMBULATORY_CARE_PROVIDER_SITE_OTHER): Payer: BLUE CROSS/BLUE SHIELD | Admitting: Podiatry

## 2017-05-08 VITALS — BP 108/67 | HR 78 | Resp 16

## 2017-05-08 DIAGNOSIS — M2022 Hallux rigidus, left foot: Secondary | ICD-10-CM

## 2017-05-08 NOTE — Progress Notes (Signed)
Subjective:   Patient ID: Kelsey Mcconnell, female   DOB: 57 y.o.   MRN: [id number]   HPI Patient states doing pretty well with the left foot with minimal discomfort and pain only if she is on it for too long of a time   ROS      Objective:  Physical Exam  Neurovascular status intact with good range of motion first MPJ left with wound edges well coapted negative Homans sign noted     Assessment:  Doing well post osteotomy first metatarsal left     Plan:  Reviewed previous x-rays indicating good position reapplied sterile dressing instructed on continued range of motion exercises elevation compression and immobilization.  Patient be seen back in 3 weeks for x-ray or earlier if needed

## 2017-05-19 ENCOUNTER — Ambulatory Visit (INDEPENDENT_AMBULATORY_CARE_PROVIDER_SITE_OTHER): Payer: BLUE CROSS/BLUE SHIELD

## 2017-05-19 ENCOUNTER — Encounter: Payer: Self-pay | Admitting: Podiatry

## 2017-05-19 ENCOUNTER — Ambulatory Visit (INDEPENDENT_AMBULATORY_CARE_PROVIDER_SITE_OTHER): Payer: BLUE CROSS/BLUE SHIELD | Admitting: Podiatry

## 2017-05-19 DIAGNOSIS — Z9889 Other specified postprocedural states: Secondary | ICD-10-CM

## 2017-05-19 DIAGNOSIS — M2012 Hallux valgus (acquired), left foot: Secondary | ICD-10-CM

## 2017-05-19 MED ORDER — AZITHROMYCIN 250 MG PO TABS: ORAL_TABLET | ORAL | 0 refills | Status: DC

## 2017-05-19 NOTE — Progress Notes (Signed)
Subjective:   Patient ID: Kelsey Mcconnell, female   DOB: 57 y.o.   MRN: [id number]   HPI Patient states she has small amount of redness at the end of the left incision site of the first metatarsal localized and was just concerned and states she wants the stitch removed   ROS      Objective:  Physical Exam  Neurovascular status intact with a small area of irritation of the distal portion of the left incision site first metatarsal with no drainage noted and a stitch that is absorbing currently that is in absorbing type stitch     Assessment:  Probably developing a small cyst suture reaction of the distal portion of the incision site left first metatarsal with mild localized redness and no proximal edema erythema or drainage noted     Plan:  Sterile removal of the distal stitch and reviewed x-rays with patient.  At this time I advised on soaks in Epsom salts and will place her on a Z-Pak as precautionary nature and I gave strict instructions if any increased redness or other issues were to occur patient is to reappoint immediately  X-rays indicate the osteotomy is healing well with no indications of pathology with joint open and good joint position

## 2017-05-28 ENCOUNTER — Other Ambulatory Visit: Payer: BLUE CROSS/BLUE SHIELD

## 2017-06-04 ENCOUNTER — Other Ambulatory Visit: Payer: Self-pay | Admitting: Podiatry

## 2017-06-04 ENCOUNTER — Encounter: Payer: Self-pay | Admitting: Podiatry

## 2017-06-04 ENCOUNTER — Ambulatory Visit (INDEPENDENT_AMBULATORY_CARE_PROVIDER_SITE_OTHER): Payer: BLUE CROSS/BLUE SHIELD | Admitting: Podiatry

## 2017-06-04 ENCOUNTER — Ambulatory Visit (INDEPENDENT_AMBULATORY_CARE_PROVIDER_SITE_OTHER): Payer: BLUE CROSS/BLUE SHIELD

## 2017-06-04 DIAGNOSIS — M84375A Stress fracture, left foot, initial encounter for fracture: Secondary | ICD-10-CM | POA: Diagnosis not present

## 2017-06-04 DIAGNOSIS — M2012 Hallux valgus (acquired), left foot: Secondary | ICD-10-CM

## 2017-06-04 DIAGNOSIS — R600 Localized edema: Secondary | ICD-10-CM

## 2017-06-04 DIAGNOSIS — M79672 Pain in left foot: Secondary | ICD-10-CM

## 2017-06-04 NOTE — Progress Notes (Signed)
Subjective:   Patient ID: Kelsey Mcconnell, female   DOB: 57 y.o.   MRN: [id number]   HPI Patient presents stating she started to develop a lot of pain in the middle of her foot with swelling and states with her big toe joint is doing really well but this is become painful as she is been very much more active the last week   ROS      Objective:  Physical Exam  Neurovascular status with edema in the left forefoot centered around the fourth metatarsal distal shaft with quite a bit of discomfort when I press deep into the tissue with negative Homans sign noted     Assessment:  Probability for stress fracture of the left foot secondary to excessive activity and walking her dog who was jerking her around     Plan:  H&P condition reviewed x-ray reviewed.  Applied an Haematologist and compression stocking to take pressure off the foot advised on elevation wearing her Cam walker and reappoint in 3 weeks  X-ray indicates that there is a suspicious sign distal fourth metatarsal shaft left but no overt signs of fracture

## 2017-06-06 NOTE — Progress Notes (Signed)
DOS 12.18.18 Bi-planar osteotomy w pin fixation 1st metatarsal Lt foot

## 2017-06-16 ENCOUNTER — Other Ambulatory Visit: Payer: BLUE CROSS/BLUE SHIELD

## 2017-06-25 ENCOUNTER — Encounter: Payer: Self-pay | Admitting: Podiatry

## 2017-06-25 ENCOUNTER — Ambulatory Visit (INDEPENDENT_AMBULATORY_CARE_PROVIDER_SITE_OTHER): Payer: BLUE CROSS/BLUE SHIELD | Admitting: Podiatry

## 2017-06-25 ENCOUNTER — Ambulatory Visit (INDEPENDENT_AMBULATORY_CARE_PROVIDER_SITE_OTHER): Payer: BLUE CROSS/BLUE SHIELD

## 2017-06-25 DIAGNOSIS — M84375D Stress fracture, left foot, subsequent encounter for fracture with routine healing: Secondary | ICD-10-CM

## 2017-06-27 NOTE — Progress Notes (Signed)
Subjective:   Patient ID: Kelsey Mcconnell, female   DOB: 57 y.o.   MRN: [id number]   HPI Patient states that the foot feels a little better on the left and she is been getting out of the boot slowly but she still has pain she is on it too much   ROS      Objective:  Physical Exam  Neurovascular status intact with discomfort that is now centered more around the neck of the second metatarsal left with mild lateral foot discomfort upon deep palpation     Assessment:  Stress fracture probable left that does appear to be slowly getting better but still is symptomatic     Plan:  H&P x-rays reviewed with patient and at this time I recommended continue rigid bottom shoes immobilization as needed ice therapy and will be seen back if symptoms are still present in around 4 weeks  X-rays indicate suspicious sign of the second metatarsal neck with most likely stress fracture and no indications currently of stress fracture of the left fourth even though there is slight change around the base of the fourth metatarsal

## 2017-07-14 ENCOUNTER — Ambulatory Visit (INDEPENDENT_AMBULATORY_CARE_PROVIDER_SITE_OTHER): Payer: BLUE CROSS/BLUE SHIELD | Admitting: Sports Medicine

## 2017-07-14 ENCOUNTER — Encounter: Payer: Self-pay | Admitting: Sports Medicine

## 2017-07-14 VITALS — BP 116/70 | Ht 67.0 in | Wt 140.0 lb

## 2017-07-14 DIAGNOSIS — M84375A Stress fracture, left foot, initial encounter for fracture: Secondary | ICD-10-CM | POA: Diagnosis not present

## 2017-07-14 NOTE — Progress Notes (Signed)
   Subjective:    Patient ID: Kelsey Mcconnell, female    DOB: May 14, 1960, 57 y.o.   MRN: [id number]  HPI chief complaint: Left foot pain  57 year old female comes in today complaining of dorsal left foot pain. She is status post left foot bunionectomy performed by Dr. Paulla Dolly in December 2018. She did well immediately postoperatively but eventually began to develop some dorsal foot pain and swelling. Follow-up visits with Dr. Paulla Dolly revealed a second metatarsal stress fracture. She resumed immobilization in a Cam Walker but it has been cumbersome. She localizes her pain to the second and third metatarsals. Second metatarsal pain is improving. Third metatarsal pain has been present for about a week. No pain over the site of surgery. Symptoms improve at rest. Swelling improved somewhat with compression.  Past medical history reviewed Medications reviewed Allergies reviewed    Review of Systems    as above Objective:   Physical Exam  Well-developed, well-nourished. No acute distress. Awake alert and oriented 3. Vital signs reviewed  Left foot: There is some mild swelling across the dorsum of the foot. Slight tenderness to palpation at the distal second metatarsal. She is also tender to palpation along the distal third metatarsal. No tenderness over the surgical site. Surgical incision appears well-healed. Good ankle range of motion. Pain with metatarsal squeeze. Good pulses. Walking with a slight limp.  X-rays of the left foot from February 20 shows evidence of her previous bunionectomy as well as a healing distal second metatarsal stress fracture. [facility] ultrasound today of the left foot also confirms a healing distal second metatarsal stress fracture. There is also cortical irregularity and edema along the distal third metatarsal suggestive of possible stress fracture here.      Assessment & Plan:   Left foot pain likely secondary to third metatarsal stress fracture Healing second  metatarsal stress fracture Status post bunionectomy, left foot  Patient has a short Cam Walker, tall Cam Walker, postop shoe, and orthotic. She needs to wear whatever is most comfortable for her and will limit movement at the forefoot until follow-up with me in 3 weeks. We will repeat her ultrasound at that time. I've also recommended that she start calcium and vitamin D and that she discuss getting a DEXA scan with her primary care physician. Patient will call with questions or concerns in the interim.

## 2017-07-14 NOTE — Patient Instructions (Signed)
You were seen today for foot pain related to a healing stress fracture of the 2nd metarsal (foot bone) and small early stress fracture of the 3rd metatarsal. These typically heal on their own over about 6 weeks and immobilization helps relieve pain and speed this process.  Stress fractures can sometimes be more common in women with lower bone density. Normal screening guidelines would be to check bone density in 60s but with risk factors or otherwise unexplained fractures consider checking this sooner. I recommend talking to your OBGYN about scheduling screening for low bone density.  Regardless while healing from a fracture start taking vitamin D and calcium supplementation can be helpful. You can get this over the counter or a combination.

## 2017-07-21 DIAGNOSIS — D1800 Hemangioma unspecified site: Secondary | ICD-10-CM | POA: Diagnosis not present

## 2017-07-21 DIAGNOSIS — D225 Melanocytic nevi of trunk: Secondary | ICD-10-CM | POA: Diagnosis not present

## 2017-07-21 DIAGNOSIS — D2230 Melanocytic nevi of unspecified part of face: Secondary | ICD-10-CM | POA: Diagnosis not present

## 2017-07-21 DIAGNOSIS — L821 Other seborrheic keratosis: Secondary | ICD-10-CM | POA: Diagnosis not present

## 2017-07-31 DIAGNOSIS — Z13820 Encounter for screening for osteoporosis: Secondary | ICD-10-CM | POA: Diagnosis not present

## 2017-08-04 ENCOUNTER — Encounter: Payer: Self-pay | Admitting: Sports Medicine

## 2017-08-04 ENCOUNTER — Ambulatory Visit (INDEPENDENT_AMBULATORY_CARE_PROVIDER_SITE_OTHER): Payer: BLUE CROSS/BLUE SHIELD | Admitting: Sports Medicine

## 2017-08-04 VITALS — BP 110/71 | Ht 68.0 in | Wt 140.0 lb

## 2017-08-04 DIAGNOSIS — M84375D Stress fracture, left foot, subsequent encounter for fracture with routine healing: Secondary | ICD-10-CM

## 2017-08-04 NOTE — Progress Notes (Addendum)
   Subjective:    Patient ID: Kelsey Mcconnell, female    DOB: 1960/09/20, 57 y.o.   MRN: [id number]  HPI   Kelsey Mcconnell comes in today for follow-up. The pain at the third metatarsal of the left foot is improving but she's now complaining of some pain in the fourth metatarsal. She's been ambulating in a very rigid boot. Minimal swelling. She did have a bone density test done last week but the results are not available for my immediate review.   Review of Systems As above    Objective:   Physical Exam  Well-developed, well-nourished. No acute distress. Awake alert and oriented 3. Vital signs reviewed  Left foot: Still some very slight swelling diffusely across the dorsum of the foot. There is no tenderness to palpation along the second metatarsal. Minimal tenderness along the third metatarsal. Slight tenderness along the fourth metatarsal. She has a rigid cavus foot. Good pulses. No significant limp.      Assessment & Plan:   Left foot pain secondary to multiple metatarsal stress fractures  I think one of the primary issues for her is her rigid cavus foot. She does have some custom orthotics created for her by podiatry but she only wears them in her tennis shoes. I recommended that she put those orthotics in her boots and follow-up again with me in 3-4 weeks. I may consider making her a new pair custom orthotics but I would want to inspect her old orthotics first. I will track down the results of her bone density test and call her with those results. I recommended 800 international units of vitamin D in 2000 mg of calcium daily regardless of her bone density test results.  Addendum: Bone density is normal.

## 2017-08-20 ENCOUNTER — Encounter: Payer: Self-pay | Admitting: Sports Medicine

## 2017-08-26 ENCOUNTER — Ambulatory Visit: Payer: BLUE CROSS/BLUE SHIELD | Admitting: Sports Medicine

## 2017-09-01 ENCOUNTER — Ambulatory Visit
Admission: RE | Admit: 2017-09-01 | Discharge: 2017-09-01 | Disposition: A | Discharge status: Home / Self Care | Payer: BLUE CROSS/BLUE SHIELD | Source: Ambulatory Visit | Attending: Sports Medicine | Admitting: Sports Medicine

## 2017-09-01 ENCOUNTER — Ambulatory Visit (INDEPENDENT_AMBULATORY_CARE_PROVIDER_SITE_OTHER): Payer: BLUE CROSS/BLUE SHIELD | Admitting: Sports Medicine

## 2017-09-01 VITALS — BP 100/72 | Ht 67.0 in | Wt 135.0 lb

## 2017-09-01 DIAGNOSIS — M79672 Pain in left foot: Secondary | ICD-10-CM | POA: Diagnosis not present

## 2017-09-01 DIAGNOSIS — M7989 Other specified soft tissue disorders: Secondary | ICD-10-CM | POA: Diagnosis not present

## 2017-09-01 DIAGNOSIS — M84375D Stress fracture, left foot, subsequent encounter for fracture with routine healing: Secondary | ICD-10-CM

## 2017-09-01 NOTE — Patient Instructions (Addendum)
We have sent a referral for you to see Dr. Maryjean Ka at [facility]. They are located at [address] [place], [place]  They should call you to schedule an appt. They may not be able to see you until June.  Go get the x-ray of your left foot. We will call you with the results.

## 2017-09-02 NOTE — Progress Notes (Signed)
   Subjective:    Patient ID: Kelsey Mcconnell, female    DOB: Mar 12, 1961, 57 y.o.   MRN: [id number]  HPI   Kelsey Mcconnell comes in today for follow-up on suspected stress fractures and stress reaction of the left foot. She was doing well up until last week. Her pain and swelling had subsided but as soon as she started trying to walk more normally her swelling returned. Her pain is tolerable. In fact, it is intermittent. But the swelling on the dorsum of her foot persists. She has not noticed any specific skin color changes. Previous x-rays and ultrasounds suggested metatarsal stress fractures and stress reactions. However, she is not responding to treatment as she should with simple metatarsal stress reactions. Recent bone density test was normal.   Review of Systems As above    Objective:   Physical Exam  Well-developed, well-nourished. No acute distress  Left foot: There continues to be a noticeable amount of swelling across the dorsum of the left foot diffusely. Slightly erythematous as well. Minimal tenderness to palpation. Well-healed surgical scar consistent with her bunionectomy. Good pulses.      Assessment & Plan:   Persistent left foot pain and swelling-question complex regional pain syndrome  This is an interesting case. Although I initially suspected her to have stress reactions in her metatarsals she is certainly not responding to treatment as I would suspect she should. I'm going to get an updated x-ray of her foot and I would like to refer her to Dr. Maryjean Mcconnell at [facility] to rule out complex regional pain syndrome. All of her symptoms began postoperatively back in January and, although this is an atypical picture, I cannot completely exclude CRPS as the cause of her pain. Patient is instructed to follow-up with me again after consultation with Dr. Maryjean Mcconnell. She is in agreement with this plan.  Addendum: X-rays reviewed. Osteotomy of the first metatarsal appears healed. There  is evidence of a healed stress fracture at the distal second metatarsal. Remainder of her x-ray is fairly unremarkable.

## 2017-09-15 DIAGNOSIS — R102 Pelvic and perineal pain: Secondary | ICD-10-CM | POA: Diagnosis not present

## 2017-09-15 DIAGNOSIS — R300 Dysuria: Secondary | ICD-10-CM | POA: Diagnosis not present

## 2017-09-15 DIAGNOSIS — N3010 Interstitial cystitis (chronic) without hematuria: Secondary | ICD-10-CM | POA: Diagnosis not present

## 2017-09-22 ENCOUNTER — Ambulatory Visit (INDEPENDENT_AMBULATORY_CARE_PROVIDER_SITE_OTHER): Payer: BLUE CROSS/BLUE SHIELD | Admitting: Podiatry

## 2017-09-22 DIAGNOSIS — T148XXA Other injury of unspecified body region, initial encounter: Secondary | ICD-10-CM

## 2017-09-25 NOTE — Progress Notes (Signed)
   HPI: 57 year old female presenting today with a chief complaint of a splinter in the plantar aspect of the right foot that she sustained three days ago. She states she was walking barefoot on a deck. She is concerned for a possible infection because she only got part of the splinter out. Patient is here for further evaluation and treatment.   Past Medical History:  Diagnosis Date  . Acne   . Allergy   . Arthritis   . Chronic constipation    ?  outlet  dysfunction  . Interstitial cystitis   . Miscarriage      Physical Exam: General: The patient is alert and oriented x3 in no acute distress.  Dermatology: Wooden splinter noted to the right plantar forefoot. Skin is warm, dry and supple bilateral lower extremities. Negative for open lesions or macerations.  Vascular: Palpable pedal pulses bilaterally. No edema or erythema noted. Capillary refill within normal limits.  Neurological: Epicritic and protective threshold grossly intact bilaterally.   Musculoskeletal Exam: Range of motion within normal limits to all pedal and ankle joints bilateral. Muscle strength 5/5 in all groups bilateral.   Assessment: 1. Splinter right forefoot   Plan of Care:  1. Patient evaluated.   2. Incision and drainage with removal of splinter with chisel blade and pickups.  3. Recommended antibiotic ointment daily with a Band-Aid for one week.  4. Return to clinic as needed.       Edrick Kins, DPM [facility]  Dr. Edrick Kins, DPM    [address], [place] [address]                Office [phone]  Fax [phone]

## 2017-09-30 DIAGNOSIS — S51801A Unspecified open wound of right forearm, initial encounter: Secondary | ICD-10-CM | POA: Diagnosis not present

## 2018-03-17 DIAGNOSIS — Z1231 Encounter for screening mammogram for malignant neoplasm of breast: Secondary | ICD-10-CM | POA: Diagnosis not present

## 2018-03-17 DIAGNOSIS — Z6821 Body mass index (BMI) 21.0-21.9, adult: Secondary | ICD-10-CM | POA: Diagnosis not present

## 2018-03-17 DIAGNOSIS — Z01419 Encounter for gynecological examination (general) (routine) without abnormal findings: Secondary | ICD-10-CM | POA: Diagnosis not present

## 2018-03-26 DIAGNOSIS — J343 Hypertrophy of nasal turbinates: Secondary | ICD-10-CM | POA: Diagnosis not present

## 2018-03-26 DIAGNOSIS — R42 Dizziness and giddiness: Secondary | ICD-10-CM | POA: Diagnosis not present

## 2018-03-26 DIAGNOSIS — H8110 Benign paroxysmal vertigo, unspecified ear: Secondary | ICD-10-CM | POA: Insufficient documentation

## 2018-03-26 DIAGNOSIS — Z7289 Other problems related to lifestyle: Secondary | ICD-10-CM | POA: Diagnosis not present

## 2018-03-31 DIAGNOSIS — R42 Dizziness and giddiness: Secondary | ICD-10-CM | POA: Diagnosis not present

## 2018-04-13 ENCOUNTER — Ambulatory Visit (INDEPENDENT_AMBULATORY_CARE_PROVIDER_SITE_OTHER): Payer: BLUE CROSS/BLUE SHIELD

## 2018-04-13 ENCOUNTER — Ambulatory Visit (INDEPENDENT_AMBULATORY_CARE_PROVIDER_SITE_OTHER): Payer: BLUE CROSS/BLUE SHIELD | Admitting: Internal Medicine

## 2018-04-13 ENCOUNTER — Encounter: Payer: Self-pay | Admitting: Internal Medicine

## 2018-04-13 VITALS — BP 110/64 | HR 80 | Temp 98.0°F | Wt 139.2 lb

## 2018-04-13 DIAGNOSIS — J22 Unspecified acute lower respiratory infection: Secondary | ICD-10-CM | POA: Diagnosis not present

## 2018-04-13 DIAGNOSIS — R05 Cough: Secondary | ICD-10-CM

## 2018-04-13 DIAGNOSIS — R058 Other specified cough: Secondary | ICD-10-CM

## 2018-04-13 DIAGNOSIS — R079 Chest pain, unspecified: Secondary | ICD-10-CM

## 2018-04-13 MED ORDER — AZITHROMYCIN 250 MG PO TABS: ORAL_TABLET | ORAL | 0 refills | Status: DC

## 2018-04-13 MED ORDER — PROMETHAZINE-DM 6.25-15 MG/5ML PO SYRP: 2.5000 mL | ORAL_SOLUTION | Freq: Four times a day (QID) | ORAL | 1 refills | Status: DC | PRN

## 2018-04-13 NOTE — Progress Notes (Signed)
Chief Complaint  Patient presents with  . Cough    cough and congestion x 3 weeks. Progressively worsening. Clear mucous. Pt feels PND and scratchy throat. Constant tickle. No fever.  Pt c/o right sided rib pain - feels she might have broken a rib coughing.     HPI: Kelsey Mcconnell 57 y.o.  SDA  For  Hard cough no fever   And some ur congestion  'had eval for vertigo and not felt to be ent otherwise but poss migraine variant  then developed above   Acute pain this am  In rib when cough really hard.     Right ant lat chest   Feels like when had rib fx with water skiing in the 1980s ( but was posterior pain)   Dry cough  For  3 weeks and can get very bad some pnd.   Allergies in spring.  But no hx asthma Has had some uri congestion and taking  Sudafed  To help  No other  Congestion meds   Has tried many otcs  To go to [facility]  Next week fot visit  Her daughter  ROS: See pertinent positives and negatives per HPI.  Past Medical History:  Diagnosis Date  . Acne   . Allergy   . Arthritis   . Chronic constipation    ?  outlet  dysfunction  . Interstitial cystitis   . Miscarriage     Family History  Problem Relation Age of Onset  . Arthritis Mother   . Fibromyalgia Mother   . Colon cancer Neg Hx   . Esophageal cancer Neg Hx   . Pancreatic cancer Neg Hx   . Rectal cancer Neg Hx   . Stomach cancer Neg Hx     Social History   Socioeconomic History  . Marital status: Married    Spouse name: Not on file  . Number of children: 3  . Years of education: Not on file  . Highest education level: Not on file  Occupational History  . Occupation: Stay at Home MOM    Employer: UNEMPLOYED  Social Needs  . Financial resource strain: Not on file  . Food insecurity:    Worry: Not on file    Inability: Not on file  . Transportation needs:    Medical: Not on file    Non-medical: Not on file  Tobacco Use  . Smoking status: Former Research scientist (life sciences)  . Smokeless tobacco: Never Used    Substance and Sexual Activity  . Alcohol use: Yes    Alcohol/week: 14.0 standard drinks    Types: 14 Glasses of wine per week  . Drug use: No  . Sexual activity: Not on file  Lifestyle  . Physical activity:    Days per week: Not on file    Minutes per session: Not on file  . Stress: Not on file  Relationships  . Social connections:    Talks on phone: Not on file    Gets together: Not on file    Attends religious service: Not on file    Active member of club or organization: Not on file    Attends meetings of clubs or organizations: Not on file    Relationship status: Not on file  Other Topics Concern  . Not on file  Social History Narrative   hh of 5    wine 2 per night    2 years of college    Married   Neg ets firearms wears  seatbelts   G4 P3   Reg exercise    Outpatient Medications Prior to Visit  Medication Sig Dispense Refill  . sertraline (ZOLOFT) 25 MG tablet Take 25 mg daily by mouth.    Marland Kitchen azithromycin (ZITHROMAX) 250 MG tablet Take as directed (Patient not taking: Reported on 04/13/2018) 6 each 0   No facility-administered medications prior to visit.      EXAM:  BP 110/64 (BP Location: Right Arm, Patient Position: Sitting, Cuff Size: Normal)   Pulse 80   Temp 98 F (36.7 C) (Oral)   Wt 139 lb 3.2 oz (63.1 kg)   SpO2 96%   BMI 21.80 kg/m   Body mass index is 21.8 kg/m. WDWN in NAD  quiet respirations; mildly congested  somewhat hoarse. Non toxic .  Splinting right chest  But no resp distress  HEENT: Normocephalic ;atraumatic , Eyes;  PERRL, EOMs  Full, lids and conjunctiva clear,,Ears: no deformities, canals nl, TM landmarks normal, Nose: no deformity or discharge but congested;face minimally tender Mouth : OP clear without lesion or edema . Neck: Supple without adenopathy or masses or bruits Chest:  Clear to Awithout wheezes rales or rhonchi  Tender  Right  Ant later chest area ? t10  No step off and no bruising  CV:  S1-S2 no gallops or murmurs  peripheral perfusion is normal Skin :nl perfusion and no acute rashes  c xray cw poss pnnitis inc int markings  No effusion  And no comment on rib abnormality  ASSESSMENT AND PLAN:  Discussed the following assessment and plan:  Respiratory tract congestion with cough - Plan: DG Chest 2 View, DG Chest 2 View  Right-sided chest pain - felt to be from chest wall - Plan: DG Chest 2 View, DG Chest 2 View  Lower respiratory infection (e.g., bronchitis, pneumonia, pneumonitis, pulmonitis) pneumonitis  Viral vs atypical bacterial   With today onset of acuter cpw pob cc  Joint injury     Expectant management. And  Disc   Comfort care and add antibiotic    ( hx of vomiting with codeine )   Ur congestion ok to take sudafed if helps but add fluids saline hydration   Fu if fever not improvement but cwp may last    A while .    No alarm  Finding today   No findings of cv pulm compromise  -Patient advised to return or notify health care team  if symptoms worsen ,persist or new concerns arise.  Patient Instructions  X ray consistent with a pneumonitis that can be viral  Or bacterial  Adding antibiotic in case bacterial   and  Comfort  Cough meds  No mention of rib fracture  But  Still could have chest wall injury  Cartilage rib    from the cough .   Can try delsym in the day .  Splint and ice  For now   aleve type med for the pain in the interim .   Warm liquids  Also to soothe  The airway .       Standley Brooking. [name] M.D.

## 2018-04-13 NOTE — Patient Instructions (Addendum)
X ray consistent with a pneumonitis that can be viral  Or bacterial  Adding antibiotic in case bacterial   and  Comfort  Cough meds  No mention of rib fracture  But  Still could have chest wall injury  Cartilage rib    from the cough .   Can try delsym in the day .  Splint and ice  For now   aleve type med for the pain in the interim .   Warm liquids  Also to soothe  The airway .

## 2018-04-21 ENCOUNTER — Ambulatory Visit (INDEPENDENT_AMBULATORY_CARE_PROVIDER_SITE_OTHER): Payer: BLUE CROSS/BLUE SHIELD | Admitting: Family Medicine

## 2018-04-21 ENCOUNTER — Ambulatory Visit (INDEPENDENT_AMBULATORY_CARE_PROVIDER_SITE_OTHER): Payer: BLUE CROSS/BLUE SHIELD

## 2018-04-21 ENCOUNTER — Encounter: Payer: Self-pay | Admitting: Family Medicine

## 2018-04-21 VITALS — BP 118/70 | HR 83 | Temp 97.8°F | Resp 12 | Ht 67.0 in | Wt 139.5 lb

## 2018-04-21 DIAGNOSIS — R053 Chronic cough: Secondary | ICD-10-CM

## 2018-04-21 DIAGNOSIS — H6980 Other specified disorders of Eustachian tube, unspecified ear: Secondary | ICD-10-CM

## 2018-04-21 DIAGNOSIS — R0781 Pleurodynia: Secondary | ICD-10-CM

## 2018-04-21 DIAGNOSIS — R05 Cough: Secondary | ICD-10-CM | POA: Diagnosis not present

## 2018-04-21 MED ORDER — FLUTICASONE PROPIONATE 50 MCG/ACT NA SUSP: 1.0000 | Freq: Two times a day (BID) | NASAL | 0 refills | Status: DC

## 2018-04-21 MED ORDER — BENZONATATE 100 MG PO CAPS: 200.0000 mg | ORAL_CAPSULE | Freq: Two times a day (BID) | ORAL | 0 refills | Status: AC | PRN

## 2018-04-21 NOTE — Progress Notes (Signed)
ACUTE VISIT  HPI:  Chief Complaint  Patient presents with  . Cough    with green mucus, sx started 3 weeks ago  . Nasal Congestion    yellow drainage    [name] is a 57 y.o.female here today complaining of at least 3 weeks of respiratory symptoms. Productive cough with clear sputum until this morning, when she started with yellowish-greenish sputum. She denies hemoptysis, dyspnea, or wheezing.  Nasal congestion and rhinorrhea.  Mild ear ache and intermittent fullness sensation,R>L. She denies hearing loss.  She was evaluated on 04/13/2017 and diagnosed with respiratory tract infection, pneumonitis. CXR: Increased interstitial markings bilaterally may reflect an interstitial pneumonia or pneumonitis. I cannot exclude smoking related changes. The interstitial markings are more conspicuous than on the December 2012 study. There is no alveolar infiltrate or CHF.   She was started on azithromycin, which did not help. She wonders if she needs another round of antibiotics. She has not noted fever, chills, or body aches.  Denies history of GERD, nausea, or heartburn. . Cough  This is a recurrent problem. The current episode started 1 to 4 weeks ago. The problem has been gradually worsening. The problem occurs every few minutes. The cough is productive of sputum. Associated symptoms include ear congestion, ear pain, nasal congestion, postnasal drip and rhinorrhea. Pertinent negatives include no chills, eye redness, fever, headaches, heartburn, hemoptysis, myalgias, rash, sore throat, shortness of breath, weight loss or wheezing. The symptoms are aggravated by lying down. She has tried OTC cough suppressant for the symptoms. Her past medical history is significant for environmental allergies. There is no history of asthma.    No Hx of recent travel. No sick contact. No known insect bite. No history of tobacco use.  Hx of allergies: History of seasonal  allergies.  OTC medications for this problem: Sudafed during the day and NyQuil at night.  She is concerned because she is planning on flying overseas, [place], in a few days.  She is also complaining about right rib cage moderate, sharp pain, exacerbated by deep breathing and coughing. She denies trauma, edema, or erythema on affected area.    Review of Systems  Constitutional: Positive for fatigue. Negative for activity change, appetite change, chills, fever and weight loss.  HENT: Positive for congestion, ear pain, postnasal drip, rhinorrhea and sinus pressure. Negative for ear discharge, hearing loss, mouth sores, sneezing, sore throat, trouble swallowing and voice change.   Eyes: Negative for discharge, redness and itching.  Respiratory: Positive for cough. Negative for hemoptysis, shortness of breath and wheezing.   Gastrointestinal: Negative for abdominal pain, diarrhea, heartburn, nausea and vomiting.  Musculoskeletal: Negative for back pain, gait problem and myalgias.  Skin: Negative for rash.  Allergic/Immunologic: Positive for environmental allergies.  Neurological: Negative for weakness and headaches.  Hematological: Negative for adenopathy. Does not bruise/bleed easily.      Current Outpatient Medications on File Prior to Visit  Medication Sig Dispense Refill  . sertraline (ZOLOFT) 25 MG tablet Take 25 mg daily by mouth.    . promethazine-dextromethorphan (PROMETHAZINE-DM) 6.25-15 MG/5ML syrup Take 2.5-5 mLs by mouth 4 (four) times daily as needed for cough. (Patient not taking: Reported on 04/21/2018) 118 mL 1   No current facility-administered medications on file prior to visit.      Past Medical History:  Diagnosis Date  . Acne   . Allergy   . Arthritis   . Chronic constipation    ?  outlet  dysfunction  . Interstitial cystitis   . Miscarriage    Allergies  Allergen Reactions  . Codeine Nausea And Vomiting    Social History   Socioeconomic History   . Marital status: Married    Spouse name: Not on file  . Number of children: 3  . Years of education: Not on file  . Highest education level: Not on file  Occupational History  . Occupation: Stay at Home MOM    Employer: UNEMPLOYED  Social Needs  . Financial resource strain: Not on file  . Food insecurity:    Worry: Not on file    Inability: Not on file  . Transportation needs:    Medical: Not on file    Non-medical: Not on file  Tobacco Use  . Smoking status: Former Research scientist (life sciences)  . Smokeless tobacco: Never Used  Substance and Sexual Activity  . Alcohol use: Yes    Alcohol/week: 14.0 standard drinks    Types: 14 Glasses of wine per week  . Drug use: No  . Sexual activity: Not on file  Lifestyle  . Physical activity:    Days per week: Not on file    Minutes per session: Not on file  . Stress: Not on file  Relationships  . Social connections:    Talks on phone: Not on file    Gets together: Not on file    Attends religious service: Not on file    Active member of club or organization: Not on file    Attends meetings of clubs or organizations: Not on file    Relationship status: Not on file  Other Topics Concern  . Not on file  Social History Narrative   hh of 5    wine 2 per night    2 years of college    Married   Neg ets firearms wears seatbelts   G4 P3   Reg exercise    Vitals:   04/21/18 1130  BP: 118/70  Pulse: 83  Resp: 12  Temp: 97.8 F (36.6 C)  SpO2: 98%   Body mass index is 21.85 kg/m.   Physical Exam  Nursing note reviewed. Constitutional: She is oriented to person, place, and time. She appears well-developed and well-nourished. She does not appear ill. No distress.  HENT:  Head: Normocephalic and atraumatic.  Right Ear: Tympanic membrane, external ear and ear canal normal.  Left Ear: Tympanic membrane, external ear and ear canal normal.  Nose: Rhinorrhea present. Right sinus exhibits no maxillary sinus tenderness and no frontal sinus  tenderness. Left sinus exhibits no maxillary sinus tenderness and no frontal sinus tenderness.  Mouth/Throat: Oropharynx is clear and moist and mucous membranes are normal.  Nasal voice. Hypertrophic turbinates. Postnasal drainage. Normal sinus transillumination.  Eyes: Conjunctivae are normal.  Neck: No muscular tenderness present.  Cardiovascular: Normal rate and regular rhythm.  No murmur heard. Respiratory: Effort normal and breath sounds normal. No stridor. No respiratory distress. She exhibits no tenderness.  Musculoskeletal:     Comments: There is no tenderness upon palpation of right rib cage or affected area.  No deformity or skin changes.  Lymphadenopathy:       Head (right side): No submandibular adenopathy present.       Head (left side): No submandibular adenopathy present.    She has no cervical adenopathy.  Neurological: She is alert and oriented to person, place, and time. She has normal strength.  Skin: Skin is warm. No rash noted. No erythema.  Psychiatric: Her  mood appears anxious.  Fairly groomed, good eye contact.    ASSESSMENT AND PLAN:   Ms. Chasya was seen today for cough and nasal congestion.  Diagnoses and all orders for this visit:  Persistent cough We discussed possible etiologies. Lung auscultation today is negative. ?  Residual symptoms after URI. Explained that antibiotic will not help. Because she is planning on leaving the country in a few days,CXR to follow on prior abnormalities was recommended. Further recommendation will be given according to CBC/chest x-ray results.  -     DG Chest 2 View; Future -     CBC with Differential/Platelet -     benzonatate (TESSALON) 100 MG capsule; Take 2 capsules (200 mg total) by mouth 2 (two) times daily as needed for up to 10 days.  Dysfunction of Eustachian tube, unspecified laterality Continue OTC Sudafed. Problem may be aggravated by flying, recommend chewing gum and/or eating hard candy during travel.   Also recommend auto inflation maneuvers every 2 hours while she is flying.  Costal margin pain Examination does not suggest drip fracture or any other concerning process. I offered a rib x-ray, she prefers to wait. Recommend avoiding shallow breathing. Instructed about warning signs.  Other orders -     fluticasone (FLONASE) 50 MCG/ACT nasal spray; Place 1 spray into both nostrils 2 (two) times daily.       Javi Bollman G. [place], MD  [facility]. [place] office.

## 2018-04-21 NOTE — Patient Instructions (Signed)
  [name] I have seen you today for an acute visit.  A few things to remember from today's visit:   Persistent cough - Plan: DG Chest 2 View, CBC with Differential/Platelet, benzonatate (TESSALON) 100 MG capsule  Dysfunction of Eustachian tube, unspecified laterality   If medications prescribed today, they will not be refill upon request, a follow up appointment with PCP will be necessary to discuss continuation of of treatment if appropriate.     In general please monitor for signs of worsening symptoms and seek immediate medical attention if any concerning.  If symptoms are not resolved in 2 weeks you should schedule a follow up appointment with your doctor, before if needed.  I hope you get better soon!

## 2018-05-06 NOTE — Progress Notes (Signed)
Chief Complaint  Patient presents with  . Cough    x 6 weeks the cough syrup didnt help states the tessalon did. Pain in ribs from coughing. Productive cough with green mucus. Runny nose     HPI: Kelsey Mcconnell 57 y.o. come in for ongoing cough  seens 12 9 and then 12 17  management   X ray was normal   Just got back from [place]   Ears clogged no current pain  But face very congestion with "green snot"  And facial pain when bending over .  Both sides of chest are sore from coughing   No feer hemoptysis   Using sudafed and nyquil   ROS: See pertinent positives and negatives per HPI. No sob  Fever   Past Medical History:  Diagnosis Date  . Acne   . Allergy   . Arthritis   . Chronic constipation    ?  outlet  dysfunction  . Interstitial cystitis   . Miscarriage     Family History  Problem Relation Age of Onset  . Arthritis Mother   . Fibromyalgia Mother   . Colon cancer Neg Hx   . Esophageal cancer Neg Hx   . Pancreatic cancer Neg Hx   . Rectal cancer Neg Hx   . Stomach cancer Neg Hx     Social History   Socioeconomic History  . Marital status: Married    Spouse name: Not on file  . Number of children: 3  . Years of education: Not on file  . Highest education level: Not on file  Occupational History  . Occupation: Stay at Home MOM    Employer: UNEMPLOYED  Social Needs  . Financial resource strain: Not on file  . Food insecurity:    Worry: Not on file    Inability: Not on file  . Transportation needs:    Medical: Not on file    Non-medical: Not on file  Tobacco Use  . Smoking status: Former Research scientist (life sciences)  . Smokeless tobacco: Never Used  Substance and Sexual Activity  . Alcohol use: Yes    Alcohol/week: 14.0 standard drinks    Types: 14 Glasses of wine per week  . Drug use: No  . Sexual activity: Not on file  Lifestyle  . Physical activity:    Days per week: Not on file    Minutes per session: Not on file  . Stress: Not on file  Relationships  .  Social connections:    Talks on phone: Not on file    Gets together: Not on file    Attends religious service: Not on file    Active member of club or organization: Not on file    Attends meetings of clubs or organizations: Not on file    Relationship status: Not on file  Other Topics Concern  . Not on file  Social History Narrative   hh of 5    wine 2 per night    2 years of college    Married   Neg ets firearms wears seatbelts   G4 P3   Reg exercise    Outpatient Medications Prior to Visit  Medication Sig Dispense Refill  . fluticasone (FLONASE) 50 MCG/ACT nasal spray Place 1 spray into both nostrils 2 (two) times daily. 16 g 0  . sertraline (ZOLOFT) 25 MG tablet Take 25 mg daily by mouth.    . promethazine-dextromethorphan (PROMETHAZINE-DM) 6.25-15 MG/5ML syrup Take 2.5-5 mLs by mouth 4 (four) times daily  as needed for cough. 118 mL 1   No facility-administered medications prior to visit.      EXAM:  BP 118/76 (BP Location: Right Arm, Patient Position: Sitting, Cuff Size: Normal)   Pulse 77   Temp 97.6 F (36.4 C) (Oral)   Wt 137 lb 14.4 oz (62.6 kg)   SpO2 96%   BMI 21.60 kg/m   Body mass index is 21.6 kg/m. WDWN in NAD  quiet respirations; very congested  somewhat hoarse. Non toxic . HEENT: Normocephalic ;atraumatic , Eyes;  PERRL, EOMs  Full, lids and conjunctiva clear,,Ears: no deformities, canals nl, TM landmarks normal, Nose: no deformity or discharge but very congested;face  Tender maxilla ? Frontal?  Mouth : OP clear without lesion or edema . Neck: Supple without adenopathy or masses or bruits Chest:  Clear to A without wheezes rales or rhonchi CV:  S1-S2 no gallops or murmurs peripheral perfusion is normal Skin :nl perfusion and no acute rashes    BP Readings from Last 3 Encounters:  05/08/18 118/76  04/21/18 118/70  04/13/18 110/64    ASSESSMENT AND PLAN:  Discussed the following assessment and plan:  Sinusitis, unspecified chronicity,  unspecified location  Cough, persistent  Dysfunction of both eustachian tubes secondary  Sinusitis    prolonged post rep infection  . recent overseas travel   Expectant management. And  Plan  Sinus hygiene and antibiotic  .    -Patient advised to return or notify health care team  if  new concerns arise.  Patient Instructions  This is a sinusitis that at this time would be best treated with antibiotic   Add saline nose spray and   Nasal cortisone  flonase and  nasacort    Every day for 2 weeks     Chest exam is good today.  Sinusitis, Adult Sinusitis is inflammation of your sinuses. Sinuses are hollow spaces in the bones around your face. Your sinuses are located:  Around your eyes.  In the middle of your forehead.  Behind your nose.  In your cheekbones. Mucus normally drains out of your sinuses. When your nasal tissues become inflamed or swollen, mucus can become trapped or blocked. This allows bacteria, viruses, and fungi to grow, which leads to infection. Most infections of the sinuses are caused by a virus. Sinusitis can develop quickly. It can last for up to 4 weeks (acute) or for more than 12 weeks (chronic). Sinusitis often develops after a cold. What are the causes? This condition is caused by anything that creates swelling in the sinuses or stops mucus from draining. This includes:  Allergies.  Asthma.  Infection from bacteria or viruses.  Deformities or blockages in your nose or sinuses.  Abnormal growths in the nose (nasal polyps).  Pollutants, such as chemicals or irritants in the air.  Infection from fungi (rare). What increases the risk? You are more likely to develop this condition if you:  Have a weak body defense system (immune system).  Do a lot of swimming or diving.  Overuse nasal sprays.  Smoke. What are the signs or symptoms? The main symptoms of this condition are pain and a feeling of pressure around the affected sinuses. Other symptoms  include:  Stuffy nose or congestion.  Thick drainage from your nose.  Swelling and warmth over the affected sinuses.  Headache.  Upper toothache.  A cough that may get worse at night.  Extra mucus that collects in the throat or the back of the nose (postnasal drip).  Decreased sense of smell and taste.  Fatigue.  A fever.  Sore throat.  Bad breath. How is this diagnosed? This condition is diagnosed based on:  Your symptoms.  Your medical history.  A physical exam.  Tests to find out if your condition is acute or chronic. This may include: ? Checking your nose for nasal polyps. ? Viewing your sinuses using a device that has a light (endoscope). ? Testing for allergies or bacteria. ? Imaging tests, such as an MRI or CT scan. In rare cases, a bone biopsy may be done to rule out more serious types of fungal sinus disease. How is this treated? Treatment for sinusitis depends on the cause and whether your condition is chronic or acute.  If caused by a virus, your symptoms should go away on their own within 10 days. You may be given medicines to relieve symptoms. They include: ? Medicines that shrink swollen nasal passages (topical intranasal decongestants). ? Medicines that treat allergies (antihistamines). ? A spray that eases inflammation of the nostrils (topical intranasal corticosteroids). ? Rinses that help get rid of thick mucus in your nose (nasal saline washes).  If caused by bacteria, your health care provider may recommend waiting to see if your symptoms improve. Most bacterial infections will get better without antibiotic medicine. You may be given antibiotics if you have: ? A severe infection. ? A weak immune system.  If caused by narrow nasal passages or nasal polyps, you may need to have surgery. Follow these instructions at home: Medicines  Take, use, or apply over-the-counter and prescription medicines only as told by your health care provider. These  may include nasal sprays.  If you were prescribed an antibiotic medicine, take it as told by your health care provider. Do not stop taking the antibiotic even if you start to feel better. Hydrate and humidify   Drink enough fluid to keep your urine pale yellow. Staying hydrated will help to thin your mucus.  Use a cool mist humidifier to keep the humidity level in your home above 50%.  Inhale steam for 10-15 minutes, 3-4 times a day, or as told by your health care provider. You can do this in the bathroom while a hot shower is running.  Limit your exposure to cool or dry air. Rest  Rest as much as possible.  Sleep with your head raised (elevated).  Make sure you get enough sleep each night. General instructions   Apply a warm, moist washcloth to your face 3-4 times a day or as told by your health care provider. This will help with discomfort.  Wash your hands often with soap and water to reduce your exposure to germs. If soap and water are not available, use hand sanitizer.  Do not smoke. Avoid being around people who are smoking (secondhand smoke).  Keep all follow-up visits as told by your health care provider. This is important. Contact a health care provider if:  You have a fever.  Your symptoms get worse.  Your symptoms do not improve within 10 days. Get help right away if:  You have a severe headache.  You have persistent vomiting.  You have severe pain or swelling around your face or eyes.  You have vision problems.  You develop confusion.  Your neck is stiff.  You have trouble breathing. Summary  Sinusitis is soreness and inflammation of your sinuses. Sinuses are hollow spaces in the bones around your face.  This condition is caused by nasal tissues that become  inflamed or swollen. The swelling traps or blocks the flow of mucus. This allows bacteria, viruses, and fungi to grow, which leads to infection.  If you were prescribed an antibiotic medicine,  take it as told by your health care provider. Do not stop taking the antibiotic even if you start to feel better.  Keep all follow-up visits as told by your health care provider. This is important. This information is not intended to replace advice given to you by your health care provider. Make sure you discuss any questions you have with your health care provider. Document Released: 04/22/2005 Document Revised: 09/22/2017 Document Reviewed: 09/22/2017 Elsevier Interactive Patient Education  2019 [place].    Eustachian Tube Dysfunction  Eustachian tube dysfunction refers to a condition in which a blockage develops in the narrow passage that connects the middle ear to the back of the nose (eustachian tube). The eustachian tube regulates air pressure in the middle ear by letting air move between the ear and nose. It also helps to drain fluid from the middle ear space. Eustachian tube dysfunction can affect one or both ears. When the eustachian tube does not function properly, air pressure, fluid, or both can build up in the middle ear. What are the causes? This condition occurs when the eustachian tube becomes blocked or cannot open normally. Common causes of this condition include:  Ear infections.  Colds and other infections that affect the nose, mouth, and throat (upper respiratory tract).  Allergies.  Irritation from cigarette smoke.  Irritation from stomach acid coming up into the esophagus (gastroesophageal reflux). The esophagus is the tube that carries food from the mouth to the stomach.  Sudden changes in air pressure, such as from descending in an airplane or scuba diving.  Abnormal growths in the nose or throat, such as: ? Growths that line the nose (nasal polyps). ? Abnormal growth of cells (tumors). ? Enlarged tissue at the back of the throat (adenoids). What increases the risk? You are more likely to develop this condition if:  You smoke.  You are  overweight.  You are a child who has: ? Certain birth defects of the mouth, such as cleft palate. ? Large tonsils or adenoids. What are the signs or symptoms? Common symptoms of this condition include:  A feeling of fullness in the ear.  Ear pain.  Clicking or popping noises in the ear.  Ringing in the ear.  Hearing loss.  Loss of balance.  Dizziness. Symptoms may get worse when the air pressure around you changes, such as when you travel to an area of high elevation, fly on an airplane, or go scuba diving. How is this diagnosed? This condition may be diagnosed based on:  Your symptoms.  A physical exam of your ears, nose, and throat.  Tests, such as those that measure: ? The movement of your eardrum (tympanogram). ? Your hearing (audiometry). How is this treated? Treatment depends on the cause and severity of your condition.  In mild cases, you may relieve your symptoms by moving air into your ears. This is called "popping the ears."  In more severe cases, or if you have symptoms of fluid in your ears, treatment may include: ? Medicines to relieve congestion (decongestants). ? Medicines that treat allergies (antihistamines). ? Nasal sprays or ear drops that contain medicines that reduce swelling (steroids). ? A procedure to drain the fluid in your eardrum (myringotomy). In this procedure, a small tube is placed in the eardrum to:  Drain the  fluid.  Restore the air in the middle ear space. ? A procedure to insert a balloon device through the nose to inflate the opening of the eustachian tube (balloon dilation). Follow these instructions at home: Lifestyle  Do not do any of the following until your health care provider approves: ? Travel to high altitudes. ? Fly in airplanes. ? Work in a Pension scheme manager or room. ? Scuba dive.  Do not use any products that contain nicotine or tobacco, such as cigarettes and e-cigarettes. If you need help quitting, ask your  health care provider.  Keep your ears dry. Wear fitted earplugs during showering and bathing. Dry your ears completely after. General instructions  Take over-the-counter and prescription medicines only as told by your health care provider.  Use techniques to help pop your ears as recommended by your health care provider. These may include: ? Chewing gum. ? Yawning. ? Frequent, forceful swallowing. ? Closing your mouth, holding your nose closed, and gently blowing as if you are trying to blow air out of your nose.  Keep all follow-up visits as told by your health care provider. This is important. Contact a health care provider if:  Your symptoms do not go away after treatment.  Your symptoms come back after treatment.  You are unable to pop your ears.  You have: ? A fever. ? Pain in your ear. ? Pain in your head or neck. ? Fluid draining from your ear.  Your hearing suddenly changes.  You become very dizzy.  You lose your balance. Summary  Eustachian tube dysfunction refers to a condition in which a blockage develops in the eustachian tube.  It can be caused by ear infections, allergies, inhaled irritants, or abnormal growths in the nose or throat.  Symptoms include ear pain, hearing loss, or ringing in the ears.  Mild cases are treated with maneuvers to unblock the ears, such as yawning or ear popping.  Severe cases are treated with medicines. Surgery may also be done (rare). This information is not intended to replace advice given to you by your health care provider. Make sure you discuss any questions you have with your health care provider. Document Released: 05/19/2015 Document Revised: 08/12/2017 Document Reviewed: 08/12/2017 Elsevier Interactive Patient Education  2019 [place] K. Tyrena Gohr M.D.

## 2018-05-08 ENCOUNTER — Encounter: Payer: Self-pay | Admitting: Internal Medicine

## 2018-05-08 ENCOUNTER — Ambulatory Visit (INDEPENDENT_AMBULATORY_CARE_PROVIDER_SITE_OTHER): Payer: BLUE CROSS/BLUE SHIELD | Admitting: Internal Medicine

## 2018-05-08 VITALS — BP 118/76 | HR 77 | Temp 97.6°F | Wt 137.9 lb

## 2018-05-08 DIAGNOSIS — R05 Cough: Secondary | ICD-10-CM

## 2018-05-08 DIAGNOSIS — J329 Chronic sinusitis, unspecified: Secondary | ICD-10-CM

## 2018-05-08 DIAGNOSIS — H6983 Other specified disorders of Eustachian tube, bilateral: Secondary | ICD-10-CM

## 2018-05-08 DIAGNOSIS — R053 Chronic cough: Secondary | ICD-10-CM

## 2018-05-08 MED ORDER — AMOXICILLIN-POT CLAVULANATE 875-125 MG PO TABS: 1.0000 | ORAL_TABLET | Freq: Two times a day (BID) | ORAL | 0 refills | Status: DC

## 2018-05-08 NOTE — Patient Instructions (Addendum)
This is a sinusitis that at this time would be best treated with antibiotic   Add saline nose spray and   Nasal cortisone  flonase and  nasacort    Every day for 2 weeks     Chest exam is good today.  Sinusitis, Adult Sinusitis is inflammation of your sinuses. Sinuses are hollow spaces in the bones around your face. Your sinuses are located:  Around your eyes.  In the middle of your forehead.  Behind your nose.  In your cheekbones. Mucus normally drains out of your sinuses. When your nasal tissues become inflamed or swollen, mucus can become trapped or blocked. This allows bacteria, viruses, and fungi to grow, which leads to infection. Most infections of the sinuses are caused by a virus. Sinusitis can develop quickly. It can last for up to 4 weeks (acute) or for more than 12 weeks (chronic). Sinusitis often develops after a cold. What are the causes? This condition is caused by anything that creates swelling in the sinuses or stops mucus from draining. This includes:  Allergies.  Asthma.  Infection from bacteria or viruses.  Deformities or blockages in your nose or sinuses.  Abnormal growths in the nose (nasal polyps).  Pollutants, such as chemicals or irritants in the air.  Infection from fungi (rare). What increases the risk? You are more likely to develop this condition if you:  Have a weak body defense system (immune system).  Do a lot of swimming or diving.  Overuse nasal sprays.  Smoke. What are the signs or symptoms? The main symptoms of this condition are pain and a feeling of pressure around the affected sinuses. Other symptoms include:  Stuffy nose or congestion.  Thick drainage from your nose.  Swelling and warmth over the affected sinuses.  Headache.  Upper toothache.  A cough that may get worse at night.  Extra mucus that collects in the throat or the back of the nose (postnasal drip).  Decreased sense of smell and taste.  Fatigue.  A  fever.  Sore throat.  Bad breath. How is this diagnosed? This condition is diagnosed based on:  Your symptoms.  Your medical history.  A physical exam.  Tests to find out if your condition is acute or chronic. This may include: ? Checking your nose for nasal polyps. ? Viewing your sinuses using a device that has a light (endoscope). ? Testing for allergies or bacteria. ? Imaging tests, such as an MRI or CT scan. In rare cases, a bone biopsy may be done to rule out more serious types of fungal sinus disease. How is this treated? Treatment for sinusitis depends on the cause and whether your condition is chronic or acute.  If caused by a virus, your symptoms should go away on their own within 10 days. You may be given medicines to relieve symptoms. They include: ? Medicines that shrink swollen nasal passages (topical intranasal decongestants). ? Medicines that treat allergies (antihistamines). ? A spray that eases inflammation of the nostrils (topical intranasal corticosteroids). ? Rinses that help get rid of thick mucus in your nose (nasal saline washes).  If caused by bacteria, your health care provider may recommend waiting to see if your symptoms improve. Most bacterial infections will get better without antibiotic medicine. You may be given antibiotics if you have: ? A severe infection. ? A weak immune system.  If caused by narrow nasal passages or nasal polyps, you may need to have surgery. Follow these instructions at home: Medicines  Take, use, or apply over-the-counter and prescription medicines only as told by your health care provider. These may include nasal sprays.  If you were prescribed an antibiotic medicine, take it as told by your health care provider. Do not stop taking the antibiotic even if you start to feel better. Hydrate and humidify   Drink enough fluid to keep your urine pale yellow. Staying hydrated will help to thin your mucus.  Use a cool mist  humidifier to keep the humidity level in your home above 50%.  Inhale steam for 10-15 minutes, 3-4 times a day, or as told by your health care provider. You can do this in the bathroom while a hot shower is running.  Limit your exposure to cool or dry air. Rest  Rest as much as possible.  Sleep with your head raised (elevated).  Make sure you get enough sleep each night. General instructions   Apply a warm, moist washcloth to your face 3-4 times a day or as told by your health care provider. This will help with discomfort.  Wash your hands often with soap and water to reduce your exposure to germs. If soap and water are not available, use hand sanitizer.  Do not smoke. Avoid being around people who are smoking (secondhand smoke).  Keep all follow-up visits as told by your health care provider. This is important. Contact a health care provider if:  You have a fever.  Your symptoms get worse.  Your symptoms do not improve within 10 days. Get help right away if:  You have a severe headache.  You have persistent vomiting.  You have severe pain or swelling around your face or eyes.  You have vision problems.  You develop confusion.  Your neck is stiff.  You have trouble breathing. Summary  Sinusitis is soreness and inflammation of your sinuses. Sinuses are hollow spaces in the bones around your face.  This condition is caused by nasal tissues that become inflamed or swollen. The swelling traps or blocks the flow of mucus. This allows bacteria, viruses, and fungi to grow, which leads to infection.  If you were prescribed an antibiotic medicine, take it as told by your health care provider. Do not stop taking the antibiotic even if you start to feel better.  Keep all follow-up visits as told by your health care provider. This is important. This information is not intended to replace advice given to you by your health care provider. Make sure you discuss any questions you  have with your health care provider. Document Released: 04/22/2005 Document Revised: 09/22/2017 Document Reviewed: 09/22/2017 Elsevier Interactive Patient Education  2019 [employer].    Eustachian Tube Dysfunction  Eustachian tube dysfunction refers to a condition in which a blockage develops in the narrow passage that connects the middle ear to the back of the nose (eustachian tube). The eustachian tube regulates air pressure in the middle ear by letting air move between the ear and nose. It also helps to drain fluid from the middle ear space. Eustachian tube dysfunction can affect one or both ears. When the eustachian tube does not function properly, air pressure, fluid, or both can build up in the middle ear. What are the causes? This condition occurs when the eustachian tube becomes blocked or cannot open normally. Common causes of this condition include:  Ear infections.  Colds and other infections that affect the nose, mouth, and throat (upper respiratory tract).  Allergies.  Irritation from cigarette smoke.  Irritation from stomach  acid coming up into the esophagus (gastroesophageal reflux). The esophagus is the tube that carries food from the mouth to the stomach.  Sudden changes in air pressure, such as from descending in an airplane or scuba diving.  Abnormal growths in the nose or throat, such as: ? Growths that line the nose (nasal polyps). ? Abnormal growth of cells (tumors). ? Enlarged tissue at the back of the throat (adenoids). What increases the risk? You are more likely to develop this condition if:  You smoke.  You are overweight.  You are a child who has: ? Certain birth defects of the mouth, such as cleft palate. ? Large tonsils or adenoids. What are the signs or symptoms? Common symptoms of this condition include:  A feeling of fullness in the ear.  Ear pain.  Clicking or popping noises in the ear.  Ringing in the ear.  Hearing loss.  Loss  of balance.  Dizziness. Symptoms may get worse when the air pressure around you changes, such as when you travel to an area of high elevation, fly on an airplane, or go scuba diving. How is this diagnosed? This condition may be diagnosed based on:  Your symptoms.  A physical exam of your ears, nose, and throat.  Tests, such as those that measure: ? The movement of your eardrum (tympanogram). ? Your hearing (audiometry). How is this treated? Treatment depends on the cause and severity of your condition.  In mild cases, you may relieve your symptoms by moving air into your ears. This is called "popping the ears."  In more severe cases, or if you have symptoms of fluid in your ears, treatment may include: ? Medicines to relieve congestion (decongestants). ? Medicines that treat allergies (antihistamines). ? Nasal sprays or ear drops that contain medicines that reduce swelling (steroids). ? A procedure to drain the fluid in your eardrum (myringotomy). In this procedure, a small tube is placed in the eardrum to:  Drain the fluid.  Restore the air in the middle ear space. ? A procedure to insert a balloon device through the nose to inflate the opening of the eustachian tube (balloon dilation). Follow these instructions at home: Lifestyle  Do not do any of the following until your health care provider approves: ? Travel to high altitudes. ? Fly in airplanes. ? Work in a Pension scheme manager or room. ? Scuba dive.  Do not use any products that contain nicotine or tobacco, such as cigarettes and e-cigarettes. If you need help quitting, ask your health care provider.  Keep your ears dry. Wear fitted earplugs during showering and bathing. Dry your ears completely after. General instructions  Take over-the-counter and prescription medicines only as told by your health care provider.  Use techniques to help pop your ears as recommended by your health care provider. These may  include: ? Chewing gum. ? Yawning. ? Frequent, forceful swallowing. ? Closing your mouth, holding your nose closed, and gently blowing as if you are trying to blow air out of your nose.  Keep all follow-up visits as told by your health care provider. This is important. Contact a health care provider if:  Your symptoms do not go away after treatment.  Your symptoms come back after treatment.  You are unable to pop your ears.  You have: ? A fever. ? Pain in your ear. ? Pain in your head or neck. ? Fluid draining from your ear.  Your hearing suddenly changes.  You become very dizzy.  You lose  your balance. Summary  Eustachian tube dysfunction refers to a condition in which a blockage develops in the eustachian tube.  It can be caused by ear infections, allergies, inhaled irritants, or abnormal growths in the nose or throat.  Symptoms include ear pain, hearing loss, or ringing in the ears.  Mild cases are treated with maneuvers to unblock the ears, such as yawning or ear popping.  Severe cases are treated with medicines. Surgery may also be done (rare). This information is not intended to replace advice given to you by your health care provider. Make sure you discuss any questions you have with your health care provider. Document Released: 05/19/2015 Document Revised: 08/12/2017 Document Reviewed: 08/12/2017 Elsevier Interactive Patient Education  2019 [employer].

## 2018-06-24 ENCOUNTER — Ambulatory Visit (INDEPENDENT_AMBULATORY_CARE_PROVIDER_SITE_OTHER): Payer: BLUE CROSS/BLUE SHIELD | Admitting: Internal Medicine

## 2018-06-24 ENCOUNTER — Encounter: Payer: Self-pay | Admitting: Internal Medicine

## 2018-06-24 VITALS — BP 120/70 | HR 75 | Temp 97.9°F | Wt 140.6 lb

## 2018-06-24 DIAGNOSIS — J069 Acute upper respiratory infection, unspecified: Secondary | ICD-10-CM

## 2018-06-24 DIAGNOSIS — J0140 Acute pansinusitis, unspecified: Secondary | ICD-10-CM | POA: Diagnosis not present

## 2018-06-24 MED ORDER — PREDNISONE 20 MG PO TABS: 20.0000 mg | ORAL_TABLET | Freq: Two times a day (BID) | ORAL | 0 refills | Status: DC

## 2018-06-24 MED ORDER — LEVOFLOXACIN 500 MG PO TABS: 500.0000 mg | ORAL_TABLET | Freq: Every day | ORAL | 0 refills | Status: AC

## 2018-06-24 NOTE — Patient Instructions (Addendum)
treating with broader spectrum antibiotic  And prednisone Stay  On saline nose spray and then Flonase after prednisone gone .   If  persistent or progressive we may get a head and sinus ct scan  To evaluate.   And see either ent or  Allergy for evaluation.  Flu can give fatigue for a while . Get blood work . Today .  Then pal follow up   ROV in 10 - 14 days .

## 2018-06-24 NOTE — Progress Notes (Signed)
Chief Complaint  Patient presents with  . Cough    Pt had flu a week ago but has had cough since thanksgiving and can not get rid of it. Sometimes productive pt states it is a green and brown color. Pt has stuffy nose, headaches, and sinus pain     HPI: Kelsey Mcconnell 58 y.o. come in for sda   she was treated for sinusitis January 3 with Augmentin never got totally better then developed influenza last couple weeks with fever that lasted 5 days and body aches.  Her daughter was treated for flu empirically at the [facility] and then she got the same symptoms.  She talked to her online doctor and got Tamiflu.  Her last fever was just over a week ago. However she has had continued facial pain and pressure right more than left with thick green sometimes brown discharge she is using Flonase and saline. She feels that she has been sick ever since November. Her cough is a drainage cough. May have gotten slightly better after the Augmentin but can continue congestion May have some seasonal allergies but is never had a chronic symptom like this  Does not have true vertigo at this time although did see ENT in the fall for vertigo and was sent to a neurologist for further evaluation. At this time she is not having those symptoms. ROS: See pertinent positives and negatives per HPI.  No vomiting falling bleeding.  Past Medical History:  Diagnosis Date  . Acne   . Allergy   . Arthritis   . Chronic constipation    ?  outlet  dysfunction  . Interstitial cystitis   . Miscarriage     Family History  Problem Relation Age of Onset  . Arthritis Mother   . Fibromyalgia Mother   . Colon cancer Neg Hx   . Esophageal cancer Neg Hx   . Pancreatic cancer Neg Hx   . Rectal cancer Neg Hx   . Stomach cancer Neg Hx     Social History   Socioeconomic History  . Marital status: Married    Spouse name: Not on file  . Number of children: 3  . Years of education: Not on file  . Highest education  level: Not on file  Occupational History  . Occupation: Stay at Home MOM    Employer: UNEMPLOYED  Social Needs  . Financial resource strain: Not on file  . Food insecurity:    Worry: Not on file    Inability: Not on file  . Transportation needs:    Medical: Not on file    Non-medical: Not on file  Tobacco Use  . Smoking status: Former Research scientist (life sciences)  . Smokeless tobacco: Never Used  Substance and Sexual Activity  . Alcohol use: Yes    Alcohol/week: 14.0 standard drinks    Types: 14 Glasses of wine per week  . Drug use: No  . Sexual activity: Not on file  Lifestyle  . Physical activity:    Days per week: Not on file    Minutes per session: Not on file  . Stress: Not on file  Relationships  . Social connections:    Talks on phone: Not on file    Gets together: Not on file    Attends religious service: Not on file    Active member of club or organization: Not on file    Attends meetings of clubs or organizations: Not on file    Relationship status: Not on file  Other Topics Concern  . Not on file  Social History Narrative   hh of 5    wine 2 per night    2 years of college    Married   Neg ets firearms wears seatbelts   G4 P3   Reg exercise    Outpatient Medications Prior to Visit  Medication Sig Dispense Refill  . sertraline (ZOLOFT) 25 MG tablet Take 25 mg daily by mouth.    . fluticasone (FLONASE) 50 MCG/ACT nasal spray Place 1 spray into both nostrils 2 (two) times daily. 16 g 0  . amoxicillin-clavulanate (AUGMENTIN) 875-125 MG tablet Take 1 tablet by mouth every 12 (twelve) hours. For sinusitis 14 tablet 0   No facility-administered medications prior to visit.      EXAM:  BP 120/70 (BP Location: Right Arm, Patient Position: Sitting, Cuff Size: Normal)   Pulse 75   Temp 97.9 F (36.6 C) (Oral)   Wt 140 lb 9.6 oz (63.8 kg)   SpO2 97%   BMI 22.02 kg/m   Body mass index is 22.02 kg/m. Well-developed well-nourished in no acute distress with significant upper  respiratory sinus congestion.  Nontoxic.  Normal respirations. Normocephalic eyes clear EOMs clear nares congested face modestly tender maxillary bilaterally negative frontal no puffiness of the face.  OP drainage tracks noted neck supple without masses thyromegaly or adenopathy Chest clear to auscultation no wheezes no rales. Neurologic appears intact and nonfocal. Skin no acute findings.   BP Readings from Last 3 Encounters:  06/24/18 120/70  05/08/18 118/76  04/21/18 118/70    ASSESSMENT AND PLAN:  Discussed the following assessment and plan:  Subacute pansinusitis - Plan: CBC with Differential/Platelet, CMP, Epstein-Barr virus VCA antibody panel  Protracted URI - Plan: CBC with Differential/Platelet, CMP, Epstein-Barr virus VCA antibody panel On going sinusitis     And then worsening with intervening       Flu illness.  Ongoing symptoms which sounds like interim flu which was a barrier to complete resolution. Consideration of underlying allergy other factors. At this time add broad-spectrum antibiotic Levaquin and 5 days of prednisone risk-benefit discussed side effects etc. Continue saline. Plan follow-up visit in 10 to 14 days and if not improving may get ENT or sinus CT etc. Blood test today CBC asks about mono we will add EBV panel etc. Discussed risk benefit and possible side effects of medicines given.  Gust use of DayQuil NyQuil can take it with current medicine however caution as side effects such as sedation. -Patient advised to return or notify health care team  if  new concerns arise.  Patient Instructions  treating with broader spectrum antibiotic  And prednisone Stay  On saline nose spray and then Flonase after prednisone gone .   If  persistent or progressive we may get a head and sinus ct scan  To evaluate.   And see either ent or  Allergy for evaluation.  Flu can give fatigue for a while . Get blood work . Today .  Then pal follow up   ROV in 10 - 14 days  .   Standley Brooking. Brailon Don M.D.

## 2018-06-25 LAB — COMPREHENSIVE METABOLIC PANEL
ALT: 63 U/L — ABNORMAL HIGH (ref 0–35)
AST: 39 U/L — ABNORMAL HIGH (ref 0–37)
Albumin: 4.1 g/dL (ref 3.5–5.2)
Alkaline Phosphatase: 110 U/L (ref 39–117)
BUN: 14 mg/dL (ref 6–23)
CO2: 28 mEq/L (ref 19–32)
Calcium: 9.2 mg/dL (ref 8.4–10.5)
Chloride: 104 mEq/L (ref 96–112)
Creatinine, Ser: 0.71 mg/dL (ref 0.40–1.20)
GFR: 84.32 mL/min (ref >60.00)
Glucose, Bld: 103 mg/dL — ABNORMAL HIGH (ref 70–99)
Potassium: 4 mEq/L (ref 3.5–5.1)
Sodium: 140 mEq/L (ref 135–145)
Total Bilirubin: 0.2 mg/dL (ref 0.2–1.2)
Total Protein: 6.4 g/dL (ref 6.0–8.3)

## 2018-06-25 LAB — CBC WITH DIFFERENTIAL/PLATELET
Basophils Absolute: 0.1 10*3/uL (ref 0.0–0.1)
Basophils Relative: 1.2 % (ref 0.0–3.0)
Eosinophils Absolute: 0.2 10*3/uL (ref 0.0–0.7)
Eosinophils Relative: 3 % (ref 0.0–5.0)
HCT: 41.7 % (ref 36.0–46.0)
Hemoglobin: 14.3 g/dL (ref 12.0–15.0)
Lymphocytes Relative: 32.9 % (ref 12.0–46.0)
Lymphs Abs: 2.2 10*3/uL (ref 0.7–4.0)
MCHC: 34.2 g/dL (ref 30.0–36.0)
MCV: 90.1 fl (ref 78.0–100.0)
Monocytes Absolute: 0.6 10*3/uL (ref 0.1–1.0)
Monocytes Relative: 8.8 % (ref 3.0–12.0)
Neutro Abs: 3.5 10*3/uL (ref 1.4–7.7)
Neutrophils Relative %: 54.1 % (ref 43.0–77.0)
Platelets: 413 10*3/uL — ABNORMAL HIGH (ref 150.0–400.0)
RBC: 4.63 Mil/uL (ref 3.87–5.11)
RDW: 12.3 % (ref 11.5–15.5)
WBC: 6.5 10*3/uL (ref 4.0–10.5)

## 2018-06-25 LAB — EPSTEIN-BARR VIRUS VCA ANTIBODY PANEL
EBV NA IgG: 554 U/mL — ABNORMAL HIGH
EBV VCA IgG: >750 U/mL — ABNORMAL HIGH
EBV VCA IgM: <36 U/mL

## 2018-06-29 ENCOUNTER — Other Ambulatory Visit: Payer: Self-pay

## 2018-06-29 DIAGNOSIS — R7989 Other specified abnormal findings of blood chemistry: Secondary | ICD-10-CM

## 2018-06-29 DIAGNOSIS — R945 Abnormal results of liver function studies: Secondary | ICD-10-CM

## 2018-07-22 DIAGNOSIS — D2230 Melanocytic nevi of unspecified part of face: Secondary | ICD-10-CM | POA: Diagnosis not present

## 2018-07-22 DIAGNOSIS — L814 Other melanin hyperpigmentation: Secondary | ICD-10-CM | POA: Diagnosis not present

## 2018-07-22 DIAGNOSIS — D225 Melanocytic nevi of trunk: Secondary | ICD-10-CM | POA: Diagnosis not present

## 2018-07-22 DIAGNOSIS — L821 Other seborrheic keratosis: Secondary | ICD-10-CM | POA: Diagnosis not present

## 2018-09-10 ENCOUNTER — Encounter: Payer: Self-pay | Admitting: Gastroenterology

## 2018-11-05 DIAGNOSIS — Z20828 Contact with and (suspected) exposure to other viral communicable diseases: Secondary | ICD-10-CM | POA: Diagnosis not present

## 2019-01-17 IMAGING — DX DG CHEST 2V
2 series · 2 of 2 positions shown · non-contrast
Comparison: Limited view of the lower chest on abdominal series of
April 11, 2011.

CLINICAL DATA: Cough and chest congestion for the past 3 weeks.
Also right upper quadrant pain

EXAM:
CHEST - 2 VIEW

[chest pa]
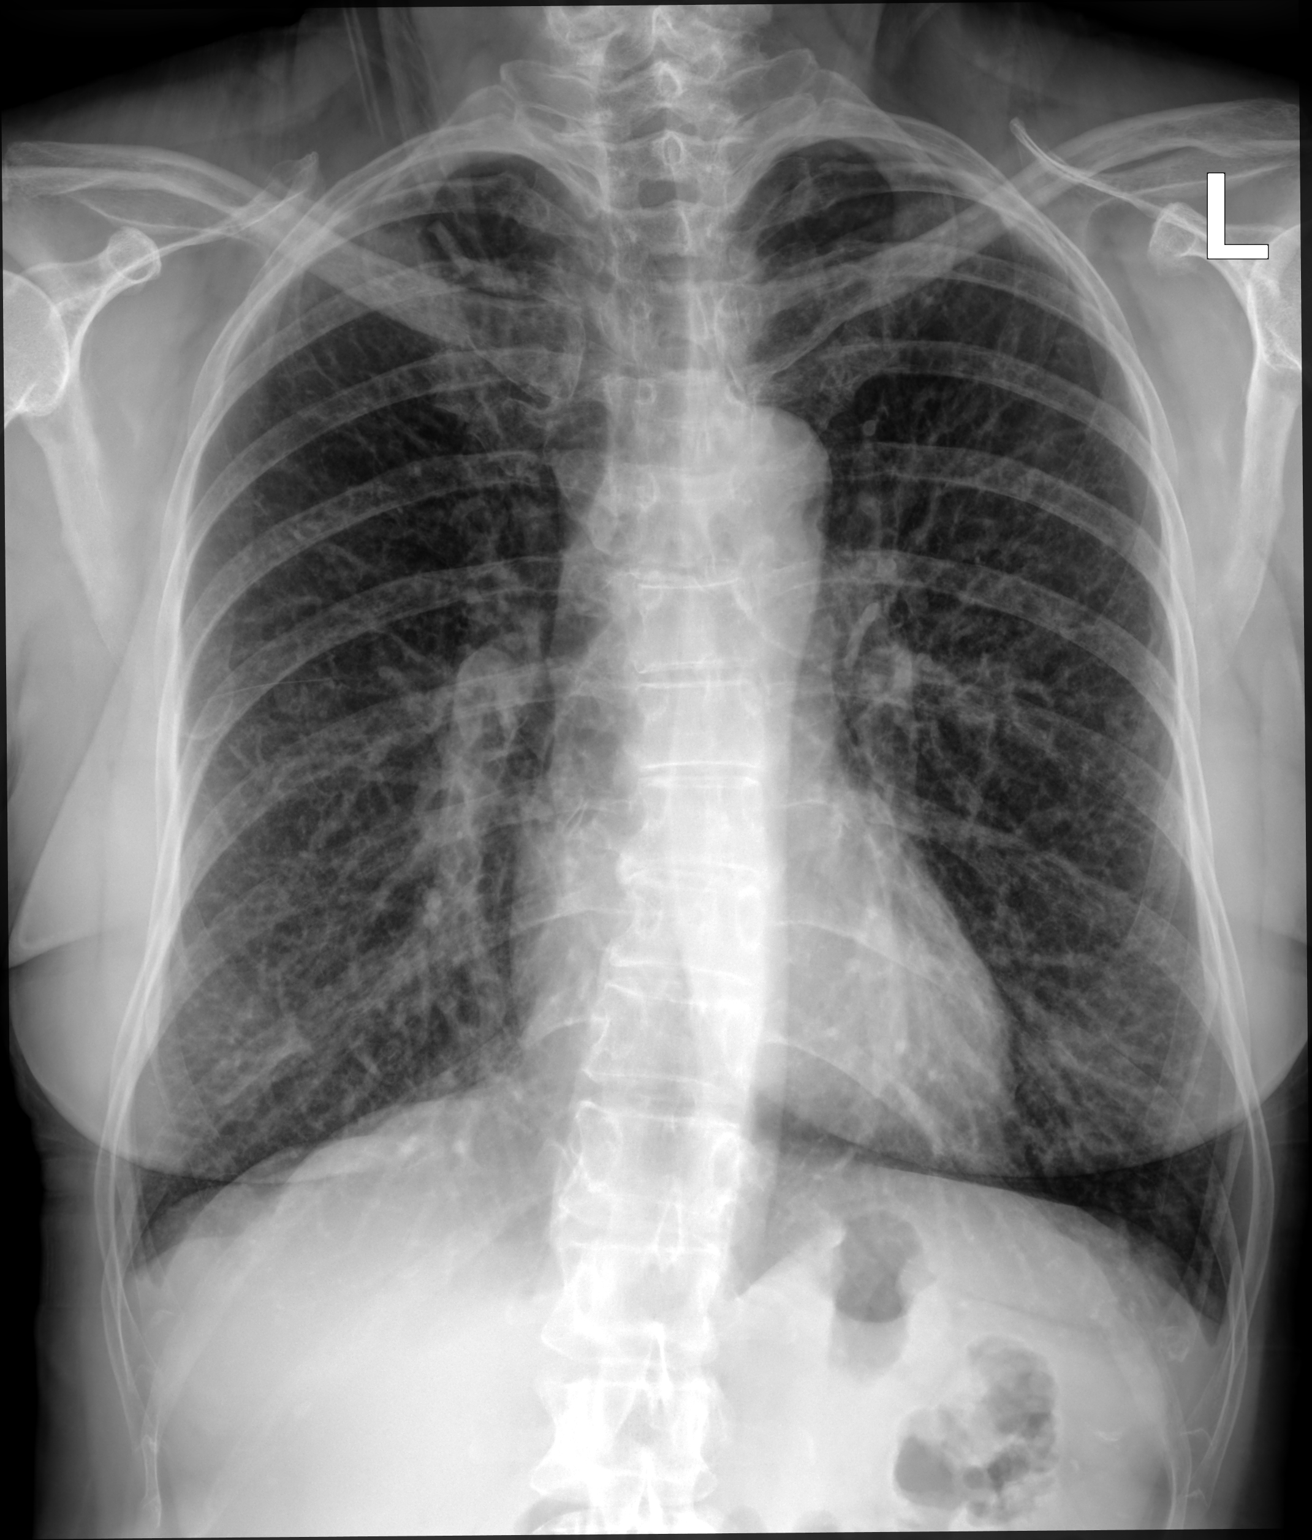

[chest lat]
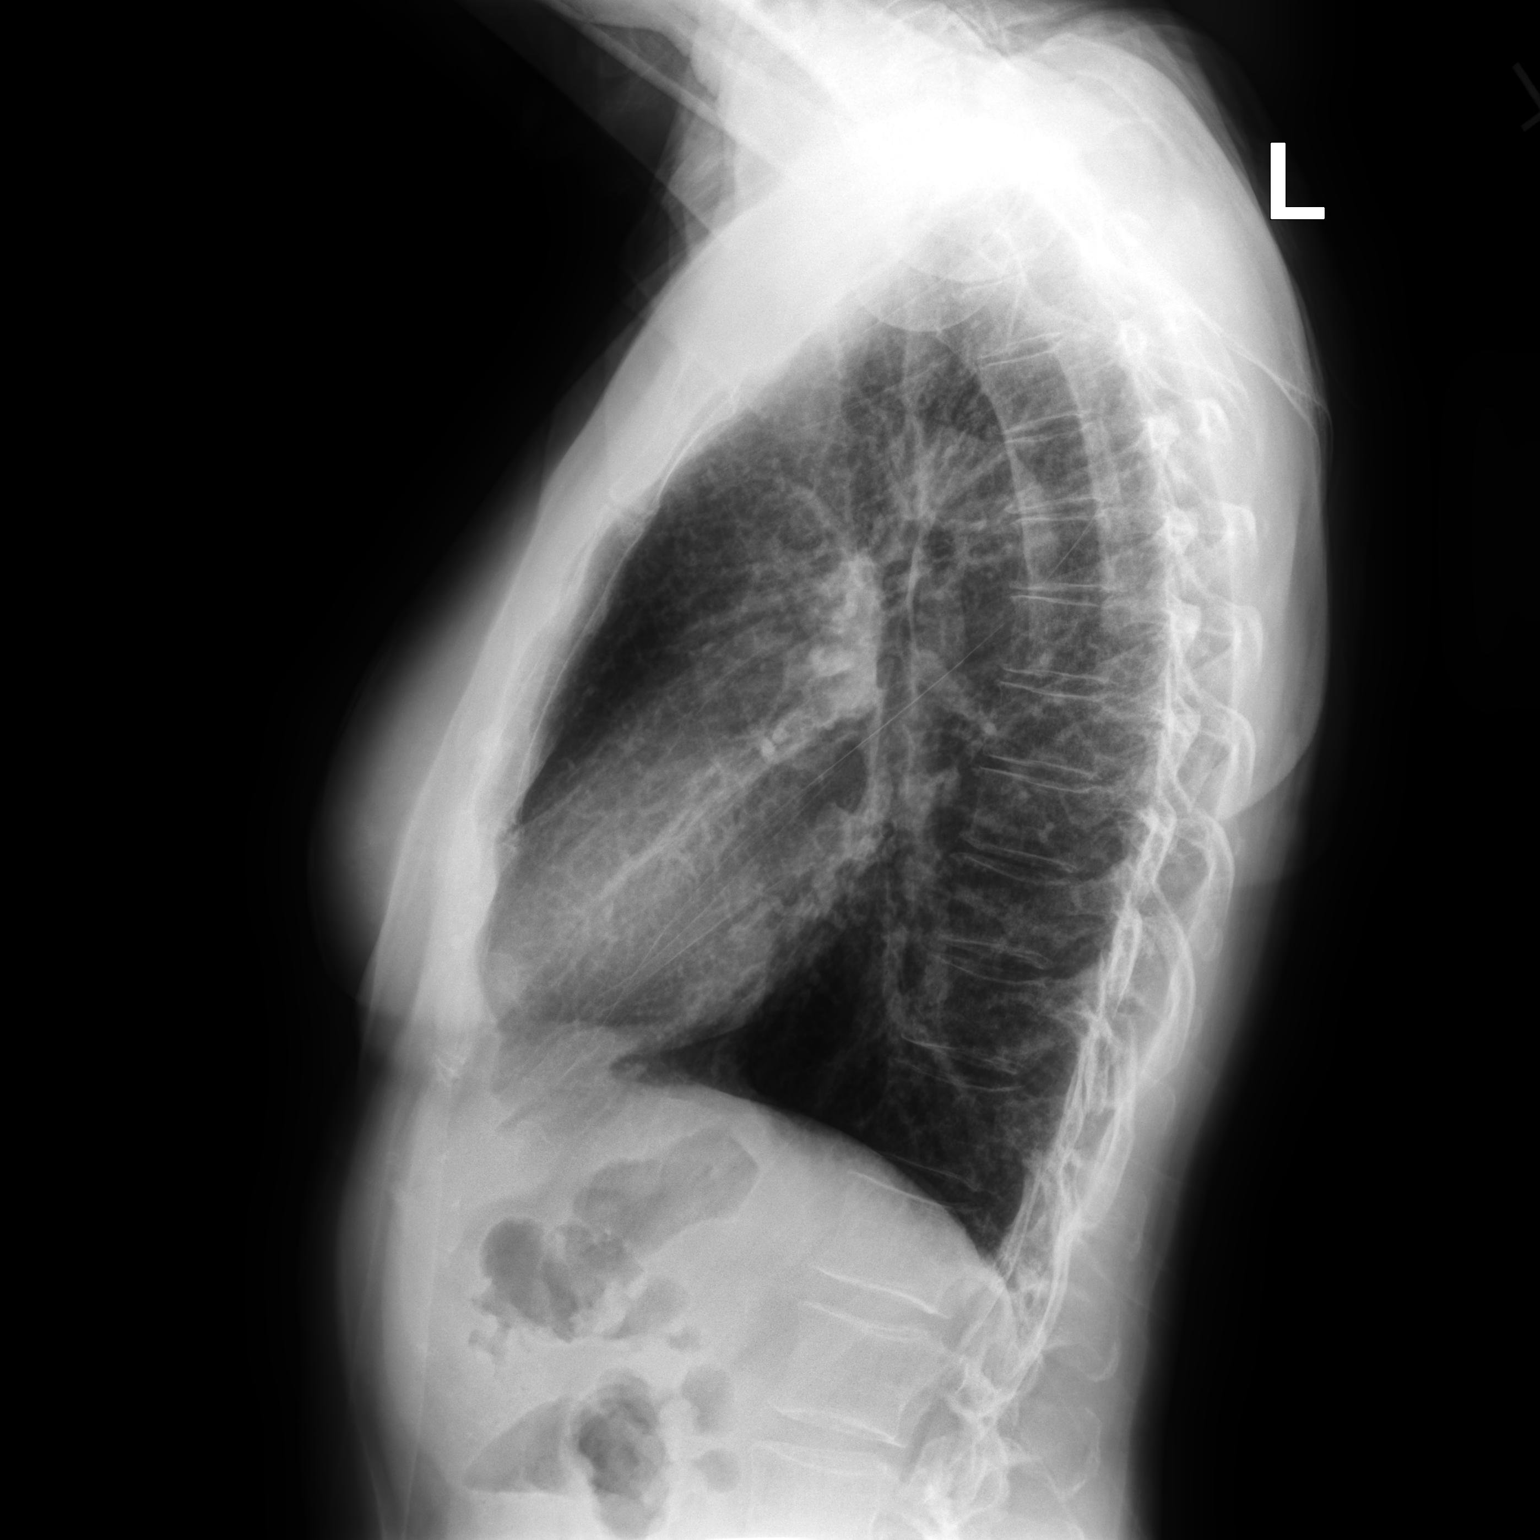

[2 of 2 positions shown; findings below may reference images not displayed]

FINDINGS: The lungs are well-expanded. The interstitial markings are increased
bilaterally. These lung markings are more conspicuous than on the
small amount of the lower lung parenchyma that was visible on an
abdominal series in April 2011. The heart and pulmonary
vascularity are normal. The mediastinum is normal in width. There is
mild levocurvature centered at T9.
IMPRESSION: Increased interstitial markings bilaterally may reflect an
interstitial pneumonia or pneumonitis. I cannot exclude smoking
related changes. The interstitial markings are more conspicuous than
on the April 2011 study. There is no alveolar infiltrate or CHF.

## 2019-02-24 ENCOUNTER — Ambulatory Visit (INDEPENDENT_AMBULATORY_CARE_PROVIDER_SITE_OTHER): Payer: BLUE CROSS/BLUE SHIELD | Admitting: Internal Medicine

## 2019-02-24 ENCOUNTER — Other Ambulatory Visit: Payer: Self-pay

## 2019-02-24 ENCOUNTER — Encounter: Payer: Self-pay | Admitting: Internal Medicine

## 2019-02-24 VITALS — BP 120/60 | HR 71 | Temp 98.3°F | Wt 138.0 lb

## 2019-02-24 DIAGNOSIS — R7989 Other specified abnormal findings of blood chemistry: Secondary | ICD-10-CM

## 2019-02-24 DIAGNOSIS — Z8619 Personal history of other infectious and parasitic diseases: Secondary | ICD-10-CM | POA: Diagnosis not present

## 2019-02-24 DIAGNOSIS — R945 Abnormal results of liver function studies: Secondary | ICD-10-CM

## 2019-02-24 DIAGNOSIS — R5383 Other fatigue: Secondary | ICD-10-CM | POA: Diagnosis not present

## 2019-02-24 DIAGNOSIS — Z8616 Personal history of COVID-19: Secondary | ICD-10-CM

## 2019-02-24 DIAGNOSIS — R0982 Postnasal drip: Secondary | ICD-10-CM

## 2019-02-24 LAB — HEPATIC FUNCTION PANEL
ALT: 37 U/L — ABNORMAL HIGH (ref 0–35)
AST: 26 U/L (ref 0–37)
Albumin: 4.4 g/dL (ref 3.5–5.2)
Alkaline Phosphatase: 77 U/L (ref 39–117)
Bilirubin, Direct: 0.1 mg/dL (ref 0.0–0.3)
Total Bilirubin: 0.4 mg/dL (ref 0.2–1.2)
Total Protein: 6.7 g/dL (ref 6.0–8.3)

## 2019-02-24 LAB — CBC WITH DIFFERENTIAL/PLATELET
Basophils Absolute: 0 10*3/uL (ref 0.0–0.1)
Basophils Relative: 0.8 % (ref 0.0–3.0)
Eosinophils Absolute: 0.1 10*3/uL (ref 0.0–0.7)
Eosinophils Relative: 2 % (ref 0.0–5.0)
HCT: 42.1 % (ref 36.0–46.0)
Hemoglobin: 14.5 g/dL (ref 12.0–15.0)
Lymphocytes Relative: 29 % (ref 12.0–46.0)
Lymphs Abs: 1.6 10*3/uL (ref 0.7–4.0)
MCHC: 34.5 g/dL (ref 30.0–36.0)
MCV: 89.6 fl (ref 78.0–100.0)
Monocytes Absolute: 0.4 10*3/uL (ref 0.1–1.0)
Monocytes Relative: 7 % (ref 3.0–12.0)
Neutro Abs: 3.4 10*3/uL (ref 1.4–7.7)
Neutrophils Relative %: 61.2 % (ref 43.0–77.0)
Platelets: 303 10*3/uL (ref 150.0–400.0)
RBC: 4.7 Mil/uL (ref 3.87–5.11)
RDW: 12.5 % (ref 11.5–15.5)
WBC: 5.6 10*3/uL (ref 4.0–10.5)

## 2019-02-24 LAB — BASIC METABOLIC PANEL
BUN: 14 mg/dL (ref 6–23)
CO2: 25 mEq/L (ref 19–32)
Calcium: 9.5 mg/dL (ref 8.4–10.5)
Chloride: 105 mEq/L (ref 96–112)
Creatinine, Ser: 0.7 mg/dL (ref 0.40–1.20)
GFR: 85.51 mL/min (ref >60.00)
Glucose, Bld: 113 mg/dL — ABNORMAL HIGH (ref 70–99)
Potassium: 4 mEq/L (ref 3.5–5.1)
Sodium: 140 mEq/L (ref 135–145)

## 2019-02-24 LAB — TSH: TSH: 1.84 u[IU]/mL (ref 0.35–4.50)

## 2019-02-24 LAB — T4, FREE: Free T4: 0.71 ng/dL (ref 0.60–1.60)

## 2019-02-24 LAB — C-REACTIVE PROTEIN: CRP: <1 mg/dL (ref 0.5–20.0)

## 2019-02-24 NOTE — Progress Notes (Signed)
-  Chief Complaint  Patient presents with   Follow-up    Pt wants to follow up about previous covid in july and go over labs     HPI: Kelsey Mcconnell 58 y.o. come in for follow-up of abnormal lab test that she developed was an illness in the late winter early spring before the Covid shutdown.  She had been treated for sinusitis and lab tests were done.  Her follow-up was delayed because of the shutdown.  Last fall she was seen by ear nose and throat for dizzy spells evaluated perhaps incomplete never got a referral.  Over the winter she had a coughing illness that was flulike.  She was treated with steroids among other things but never got fully better and did not get her follow-up labs t      Then got covid 40 when she had to caretaker father who had fallen and got a subdural hematoma and was found to had Covid at the emergency room.    Had fever throat sx .  Low grade  For 10 days.  Fatigue . Self care.  Isolation.  Then fatigue for 2 months .  Until this month  And  Some good days bad days.   Has ongoing throat learing.   Without other.  Her mother had illness to but had a mild with easier recovery.  She comes in today as follow-up of the situation she still has some ongoing congestion but no fever shortness of breath has some throat clearing cough that has been ongoing. Currently they are trying to sell their house she is basically isolated at home.   ROS: See pertinent positives and negatives per HPI.  Past Medical History:  Diagnosis Date   Acne    Allergy    Arthritis    Chronic constipation    ?  outlet  dysfunction   Interstitial cystitis    Miscarriage     Family History  Problem Relation Age of Onset   Arthritis Mother    Fibromyalgia Mother    Colon cancer Neg Hx    Esophageal cancer Neg Hx    Pancreatic cancer Neg Hx    Rectal cancer Neg Hx    Stomach cancer Neg Hx     Social History   Socioeconomic History   Marital status: Married   Spouse name: Not on file   Number of children: 3   Years of education: Not on file   Highest education level: Not on file  Occupational History   Occupation: Stay at Home MOM    Employer: UNEMPLOYED  Social Needs   Emergency planning/management officer strain: Not on file   Food insecurity    Worry: Not on file    Inability: Not on file   Transportation needs    Medical: Not on file    Non-medical: Not on file  Tobacco Use   Smoking status: Former Smoker   Smokeless tobacco: Never Used  Substance and Sexual Activity   Alcohol use: Yes    Alcohol/week: 14.0 standard drinks    Types: 14 Glasses of wine per week   Drug use: No   Sexual activity: Not on file  Lifestyle   Physical activity    Days per week: Not on file    Minutes per session: Not on file   Stress: Not on file  Relationships   Social connections    Talks on phone: Not on file    Gets together: Not on file  Attends religious service: Not on file    Active member of club or organization: Not on file    Attends meetings of clubs or organizations: Not on file    Relationship status: Not on file  Other Topics Concern   Not on file  Social History Narrative   hh of 5    wine 2 per night    2 years of college    Married   Neg ets firearms wears seatbelts   G4 P3   Reg exercise    Outpatient Medications Prior to Visit  Medication Sig Dispense Refill   sertraline (ZOLOFT) 25 MG tablet Take 25 mg daily by mouth.     fluticasone (FLONASE) 50 MCG/ACT nasal spray Place 1 spray into both nostrils 2 (two) times daily. 16 g 0   predniSONE (DELTASONE) 20 MG tablet Take 1 tablet (20 mg total) by mouth 2 (two) times daily with a meal. 10 tablet 0   No facility-administered medications prior to visit.      EXAM:  BP 120/60 (BP Location: Right Arm, Patient Position: Sitting, Cuff Size: Normal)    Pulse 71    Temp 98.3 F (36.8 C) (Temporal)    Wt 138 lb (62.6 kg)    SpO2 97%    BMI 21.61 kg/m   Body mass index  is 21.61 kg/m.  GENERAL: vitals reviewed and listed above, alert, oriented, appears well hydrated and in no acute distress HEENT: atraumatic, conjunctiva  clear, no obvious abnormalities on inspection of external nose and ears OP masked  Sounds congested   mildy in the head  tms clear  NECK: no obvious masses on inspection palpation  LUNGS: clear to auscultation bilaterally, no wheezes, rales or rhonchi, good air movement CV: HRRR, no clubbing cyanosis or  peripheral edema nl cap refill  Abdomen:  Sof,t normal bowel sounds without hepatosplenomegaly, no guarding rebound or masses no CVA tenderness No obv adenopathy  Skin no unusual rashes petechia  MS: moves all extremities without noticeable focal  abnormality PSYCH: pleasant and cooperative, no obvious depression or anxiety Lab Results  Component Value Date   WBC 5.6 02/24/2019   HGB 14.5 02/24/2019   HCT 42.1 02/24/2019   PLT 303.0 02/24/2019   GLUCOSE 113 (H) 02/24/2019   CHOL 190 09/01/2015   TRIG 127.0 09/01/2015   HDL 63.00 09/01/2015   LDLCALC 102 (H) 09/01/2015   ALT 37 (H) 02/24/2019   AST 26 02/24/2019   NA 140 02/24/2019   K 4.0 02/24/2019   CL 105 02/24/2019   CREATININE 0.70 02/24/2019   BUN 14 02/24/2019   CO2 25 02/24/2019   TSH 1.84 02/24/2019   BP Readings from Last 3 Encounters:  02/24/19 120/60  06/24/18 120/70  05/08/18 118/76   Lab today is not fasting atie before she came  ASSESSMENT AND PLAN:  Discussed the following assessment and plan:  Abnormal LFTs (liver function tests) - Plan: Hepatic function panel, Basic metabolic panel, CBC with Differential/Platelet, TSH, T4, free, SAR CoV2 Serology (COVID 19)AB(IGG)IA, Hepatitis C antibody, HIV Antibody (routine testing w rflx), Hep B Surface Antibody, Hep B Surface Antigen, C-reactive protein  History of 2019 novel coronavirus disease (COVID-19) - Plan: Hepatic function panel, Basic metabolic panel, CBC with Differential/Platelet, TSH, T4, free, SAR  CoV2 Serology (COVID 19)AB(IGG)IA, Hepatitis C antibody, HIV Antibody (routine testing w rflx), Hep B Surface Antibody, Hep B Surface Antigen, C-reactive protein  Post-nasal drainage - Plan: Hepatic function panel, Basic metabolic panel, CBC with Differential/Platelet,  TSH, T4, free, SAR CoV2 Serology (COVID 19)AB(IGG)IA, Hepatitis C antibody, HIV Antibody (routine testing w rflx), Hep B Surface Antibody, Hep B Surface Antigen, C-reactive protein  Other fatigue - Plan: Hepatic function panel, Basic metabolic panel, CBC with Differential/Platelet, TSH, T4, free, SAR CoV2 Serology (COVID 19)AB(IGG)IA, Hepatitis C antibody, HIV Antibody (routine testing w rflx), Hep B Surface Antibody, Hep B Surface Antigen, C-reactive protein The laboratory abnormalities in the spring were most likely related to the illness itself but needs follow-up and will do hepatitis screens at the same time she is interested in donating plasma if possible asked for the antibody test discussed limitations of doing that test but will add on today's lab panel.  She can call the [facility] about further directions  At some point she may benefit from an allergy referral has it appears that she has had some chronic sinusitis over the last 6 months ear nose and throat did not do imaging as far as we know but at that point her upper respiratory congestion was noted as persistent or progressive. I would guess that she has post Covid fatigue in addition although is doing much better by report.  Her exam is reassuring today.  We discussed getting a flu vaccine she had some concerns but will readdress.  -Patient advised to return or notify health care team  if  new concerns arise.  Patient Instructions  Our exam is good today .  Will notify you  of labs when available.   consideration of getting back with ent and or allergist regarding the ongoing sinus congestion.    Advise get a flu  Shot when possible       Standley Brooking. Blessen Kimbrough M.D.

## 2019-02-24 NOTE — Patient Instructions (Signed)
Our exam is good today .  Will notify you  of labs when available.   consideration of getting back with ent and or allergist regarding the ongoing sinus congestion.    Advise get a flu  Shot when possible

## 2019-02-25 LAB — HEPATITIS C ANTIBODY
Hepatitis C Ab: NONREACTIVE
SIGNAL TO CUT-OFF: 0.01 (ref <1.00)

## 2019-02-25 LAB — HEPATITIS B SURFACE ANTIGEN: Hepatitis B Surface Ag: NONREACTIVE

## 2019-02-25 LAB — HIV ANTIBODY (ROUTINE TESTING W REFLEX): HIV 1&2 Ab, 4th Generation: NONREACTIVE

## 2019-02-25 LAB — HEPATITIS B SURFACE ANTIBODY,QUALITATIVE: Hep B S Ab: NONREACTIVE

## 2019-02-25 LAB — SAR COV2 SEROLOGY (COVID19)AB(IGG),IA: SARS CoV2 AB IGG: POSITIVE — AB

## 2019-03-01 ENCOUNTER — Other Ambulatory Visit: Payer: Self-pay | Admitting: *Deleted

## 2019-03-01 DIAGNOSIS — R945 Abnormal results of liver function studies: Secondary | ICD-10-CM

## 2019-03-01 DIAGNOSIS — R7989 Other specified abnormal findings of blood chemistry: Secondary | ICD-10-CM

## 2019-03-24 DIAGNOSIS — Z01419 Encounter for gynecological examination (general) (routine) without abnormal findings: Secondary | ICD-10-CM | POA: Diagnosis not present

## 2019-03-24 DIAGNOSIS — Z1231 Encounter for screening mammogram for malignant neoplasm of breast: Secondary | ICD-10-CM | POA: Diagnosis not present

## 2019-03-24 DIAGNOSIS — Z6822 Body mass index (BMI) 22.0-22.9, adult: Secondary | ICD-10-CM | POA: Diagnosis not present

## 2019-05-17 DIAGNOSIS — L292 Pruritus vulvae: Secondary | ICD-10-CM | POA: Diagnosis not present

## 2019-05-17 DIAGNOSIS — L720 Epidermal cyst: Secondary | ICD-10-CM | POA: Diagnosis not present

## 2019-05-17 DIAGNOSIS — L719 Rosacea, unspecified: Secondary | ICD-10-CM | POA: Diagnosis not present

## 2019-05-17 DIAGNOSIS — L700 Acne vulgaris: Secondary | ICD-10-CM | POA: Diagnosis not present

## 2019-06-28 ENCOUNTER — Encounter: Payer: Self-pay | Admitting: Gastroenterology

## 2019-07-20 DIAGNOSIS — F419 Anxiety disorder, unspecified: Secondary | ICD-10-CM | POA: Insufficient documentation

## 2019-07-30 ENCOUNTER — Other Ambulatory Visit: Payer: Self-pay

## 2019-07-30 ENCOUNTER — Ambulatory Visit (AMBULATORY_SURGERY_CENTER): Payer: Self-pay | Admitting: *Deleted

## 2019-07-30 VITALS — Temp 96.7°F | Ht 67.0 in | Wt 136.0 lb

## 2019-07-30 DIAGNOSIS — Z01818 Encounter for other preprocedural examination: Secondary | ICD-10-CM

## 2019-07-30 DIAGNOSIS — Z86010 Personal history of colonic polyps: Secondary | ICD-10-CM

## 2019-07-30 MED ORDER — NA SULFATE-K SULFATE-MG SULF 17.5-3.13-1.6 GM/177ML PO SOLN: 1.0000 | Freq: Once | ORAL | 0 refills | Status: AC

## 2019-07-30 NOTE — Progress Notes (Signed)

## 2019-08-03 DIAGNOSIS — L814 Other melanin hyperpigmentation: Secondary | ICD-10-CM | POA: Diagnosis not present

## 2019-08-03 DIAGNOSIS — D225 Melanocytic nevi of trunk: Secondary | ICD-10-CM | POA: Diagnosis not present

## 2019-08-03 DIAGNOSIS — L821 Other seborrheic keratosis: Secondary | ICD-10-CM | POA: Diagnosis not present

## 2019-08-03 DIAGNOSIS — D2230 Melanocytic nevi of unspecified part of face: Secondary | ICD-10-CM | POA: Diagnosis not present

## 2019-08-10 ENCOUNTER — Other Ambulatory Visit: Payer: Self-pay | Admitting: Gastroenterology

## 2019-08-10 ENCOUNTER — Ambulatory Visit (INDEPENDENT_AMBULATORY_CARE_PROVIDER_SITE_OTHER): Payer: BLUE CROSS/BLUE SHIELD

## 2019-08-10 DIAGNOSIS — Z1159 Encounter for screening for other viral diseases: Secondary | ICD-10-CM

## 2019-08-11 LAB — SARS CORONAVIRUS 2 (TAT 6-24 HRS): SARS Coronavirus 2: NEGATIVE

## 2019-08-12 ENCOUNTER — Encounter: Payer: Self-pay | Admitting: Gastroenterology

## 2019-08-13 ENCOUNTER — Other Ambulatory Visit: Payer: Self-pay

## 2019-08-13 ENCOUNTER — Ambulatory Visit (AMBULATORY_SURGERY_CENTER): Payer: BLUE CROSS/BLUE SHIELD | Admitting: Gastroenterology

## 2019-08-13 ENCOUNTER — Encounter: Payer: Self-pay | Admitting: Gastroenterology

## 2019-08-13 VITALS — BP 98/77 | HR 74 | Temp 96.8°F | Resp 14 | Ht 67.0 in | Wt 136.0 lb

## 2019-08-13 DIAGNOSIS — K621 Rectal polyp: Secondary | ICD-10-CM | POA: Diagnosis not present

## 2019-08-13 DIAGNOSIS — K5520 Angiodysplasia of colon without hemorrhage: Secondary | ICD-10-CM

## 2019-08-13 DIAGNOSIS — K635 Polyp of colon: Secondary | ICD-10-CM | POA: Diagnosis not present

## 2019-08-13 DIAGNOSIS — Z1211 Encounter for screening for malignant neoplasm of colon: Secondary | ICD-10-CM | POA: Diagnosis not present

## 2019-08-13 DIAGNOSIS — Z86010 Personal history of colonic polyps: Secondary | ICD-10-CM

## 2019-08-13 DIAGNOSIS — D125 Benign neoplasm of sigmoid colon: Secondary | ICD-10-CM

## 2019-08-13 DIAGNOSIS — D128 Benign neoplasm of rectum: Secondary | ICD-10-CM

## 2019-08-13 DIAGNOSIS — D127 Benign neoplasm of rectosigmoid junction: Secondary | ICD-10-CM | POA: Diagnosis not present

## 2019-08-13 MED ORDER — SODIUM CHLORIDE 0.9 % IV SOLN: 500.0000 mL | Freq: Once | INTRAVENOUS | Status: DC

## 2019-08-13 NOTE — Op Note (Signed)
[facility] Patient Name: Kelsey Mcconnell Procedure Date: 08/13/2019 11:40 AM MRN: [id number] Endoscopist: Mauri Pole , MD Age: 59 Referring MD:  Date of Birth: [birth year] Gender: Female Account #: [id number] Procedure:                Colonoscopy Indications:              High risk colon cancer surveillance: Personal                            history of colonic polyps, High risk colon cancer                            surveillance: Personal history of adenoma less than                            10 mm in size, High risk colon cancer surveillance:                            Personal history of sessile serrated colon polyp                            (less than 10 mm in size) with no dysplasia Medicines:                Monitored Anesthesia Care Procedure:                Pre-Anesthesia Assessment:                           - Prior to the procedure, a History and Physical                            was performed, and patient medications and                            allergies were reviewed. The patient's tolerance of                            previous anesthesia was also reviewed. The risks                            and benefits of the procedure and the sedation                            options and risks were discussed with the patient.                            All questions were answered, and informed consent                            was obtained. Prior Anticoagulants: The patient has                            taken no previous anticoagulant or antiplatelet  agents. ASA Grade Assessment: II - A patient with                            mild systemic disease. After reviewing the risks                            and benefits, the patient was deemed in                            satisfactory condition to undergo the procedure.                           After obtaining informed consent, the colonoscope                            was passed  under direct vision. Throughout the                            procedure, the patient's blood pressure, pulse, and                            oxygen saturations were monitored continuously. The                            Colonoscope was introduced through the anus and                            advanced to the the cecum, identified by                            appendiceal orifice and ileocecal valve. The                            colonoscopy was performed without difficulty. The                            patient tolerated the procedure well. The quality                            of the bowel preparation was good. The ileocecal                            valve, appendiceal orifice, and rectum were                            photographed. Scope In: 11:50:40 AM Scope Out: 12:15:46 PM Scope Withdrawal Time: 0 hours 20 minutes 19 seconds  Total Procedure Duration: 0 hours 25 minutes 6 seconds  Findings:                 The perianal and digital rectal examinations were                            normal.  A single large angioectasia with typical                            arborization was found in the cecum.                           A 5 mm polyp was found in the transverse colon. The                            polyp was sessile. The polyp was removed with a                            cold snare. Resection and retrieval were complete.                           Three sessile polyps were found in the rectum and                            sigmoid colon. The polyps were 1 to 2 mm in size.                            These polyps were removed with a cold biopsy                            forceps. Resection and retrieval were complete.                           Non-bleeding internal hemorrhoids were found during                            retroflexion. The hemorrhoids were small.                           The exam was otherwise without abnormality. Complications:             No immediate complications. Estimated Blood Loss:     Estimated blood loss was minimal. Impression:               - A single colonic angioectasia.                           - One 5 mm polyp in the transverse colon, removed                            with a cold snare. Resected and retrieved.                           - Three 1 to 2 mm polyps in the rectum and in the                            sigmoid colon, removed with a cold biopsy forceps.  Resected and retrieved.                           - Non-bleeding internal hemorrhoids.                           - The examination was otherwise normal. Recommendation:           - Patient has a contact number available for                            emergencies. The signs and symptoms of potential                            delayed complications were discussed with the                            patient. Return to normal activities tomorrow.                            Written discharge instructions were provided to the                            patient.                           - Resume previous diet.                           - Continue present medications.                           - Await pathology results.                           - Repeat colonoscopy in 3 - 5 years for                            surveillance based on pathology results. Mauri Pole, MD 08/13/2019 12:20:08 PM This report has been signed electronically.

## 2019-08-13 NOTE — Progress Notes (Signed)
To PACU, VSS. Report to [email or web address] 

## 2019-08-13 NOTE — Patient Instructions (Addendum)
Impression/Recommendations:  Polyp handout given to patient. Hemorrhoid handout given to patient.  Resume previous diet. Continue present medications. Await pathology results.  Call Dr. Woodward Ku office to make a follow-up appointment at [phone].  Repeat colonoscopy in 3-5 years for surveillance.    YOU HAD AN ENDOSCOPIC PROCEDURE TODAY AT [facility]:   Refer to the procedure report that was given to you for any specific questions about what was found during the examination.  If the procedure report does not answer your questions, please call your gastroenterologist to clarify.  If you requested that your care partner not be given the details of your procedure findings, then the procedure report has been included in a sealed envelope for you to review at your convenience later.  YOU SHOULD EXPECT: Some feelings of bloating in the abdomen. Passage of more gas than usual.  Walking can help get rid of the air that was put into your GI tract during the procedure and reduce the bloating. If you had a lower endoscopy (such as a colonoscopy or flexible sigmoidoscopy) you may notice spotting of blood in your stool or on the toilet paper. If you underwent a bowel prep for your procedure, you may not have a normal bowel movement for a few days.  Please Note:  You might notice some irritation and congestion in your nose or some drainage.  This is from the oxygen used during your procedure.  There is no need for concern and it should clear up in a day or so.  SYMPTOMS TO REPORT IMMEDIATELY:   Following lower endoscopy (colonoscopy or flexible sigmoidoscopy):  Excessive amounts of blood in the stool  Significant tenderness or worsening of abdominal pains  Swelling of the abdomen that is new, acute  Fever of 100F or higher For urgent or emergent issues, a gastroenterologist can be reached at any hour by calling [phone]. Do not use MyChart messaging for urgent concerns.     DIET:  We do recommend a small meal at first, but then you may proceed to your regular diet.  Drink plenty of fluids but you should avoid alcoholic beverages for 24 hours.  ACTIVITY:  You should plan to take it easy for the rest of today and you should NOT DRIVE or use heavy machinery until tomorrow (because of the sedation medicines used during the test).    FOLLOW UP: Our staff will call the number listed on your records 48-72 hours following your procedure to check on you and address any questions or concerns that you may have regarding the information given to you following your procedure. If we do not reach you, we will leave a message.  We will attempt to reach you two times.  During this call, we will ask if you have developed any symptoms of COVID 19. If you develop any symptoms (ie: fever, flu-like symptoms, shortness of breath, cough etc.) before then, please call [phone].  If you test positive for Covid 19 in the 2 weeks post procedure, please call and report this information to [place].    If any biopsies were taken you will be contacted by phone or by letter within the next 1-3 weeks.  Please call us at [phone] if you have not heard about the biopsies in 3 weeks.    SIGNATURES/CONFIDENTIALITY: You and/or your care partner have signed paperwork which will be entered into your electronic medical record.  These signatures attest to the fact that that the information above on your  After Visit Summary has been reviewed and is understood.  Full responsibility of the confidentiality of this discharge information lies with you and/or your care-partner.

## 2019-08-13 NOTE — Progress Notes (Signed)
Temp by JB Vitals by CW  Pt's states no medical or surgical changes since previsit or office visit.  

## 2019-08-13 NOTE — Progress Notes (Signed)
Called to room to assist during endoscopic procedure.  Patient ID and intended procedure confirmed with present staff. Received instructions for my participation in the procedure from the performing physician.  

## 2019-08-17 ENCOUNTER — Telehealth: Payer: Self-pay

## 2019-08-17 NOTE — Telephone Encounter (Signed)
  Follow up Call-  Call back number 08/13/2019  Post procedure Call Back phone  # [phone]  Permission to leave phone message Yes  Some recent data might be hidden     Patient questions:  Do you have a fever, pain , or abdominal swelling? No. Pain Score  0 *  Have you tolerated food without any problems? Yes.    Have you been able to return to your normal activities? Yes.    Do you have any questions about your discharge instructions: Diet   No. Medications  No. Follow up visit  No.  Do you have questions or concerns about your Care? No.  Actions: * If pain score is 4 or above: No action needed, pain <4. 1. Have you developed a fever since your procedure? no  2.   Have you had an respiratory symptoms (SOB or cough) since your procedure? no  3.   Have you tested positive for COVID 19 since your procedure no  4.   Have you had any family members/close contacts diagnosed with the COVID 19 since your procedure?  no   If yes to any of these questions please route to Joylene John, RN and Erenest Rasher, RN

## 2019-08-24 ENCOUNTER — Encounter: Payer: Self-pay | Admitting: Gastroenterology

## 2019-12-14 ENCOUNTER — Ambulatory Visit (INDEPENDENT_AMBULATORY_CARE_PROVIDER_SITE_OTHER): Payer: BLUE CROSS/BLUE SHIELD | Admitting: Internal Medicine

## 2019-12-14 ENCOUNTER — Other Ambulatory Visit: Payer: Self-pay

## 2019-12-14 ENCOUNTER — Encounter: Payer: Self-pay | Admitting: Internal Medicine

## 2019-12-14 VITALS — BP 116/72 | HR 71 | Temp 98.5°F | Ht 67.0 in | Wt 139.0 lb

## 2019-12-14 DIAGNOSIS — Z8616 Personal history of COVID-19: Secondary | ICD-10-CM | POA: Diagnosis not present

## 2019-12-14 DIAGNOSIS — R945 Abnormal results of liver function studies: Secondary | ICD-10-CM

## 2019-12-14 DIAGNOSIS — R42 Dizziness and giddiness: Secondary | ICD-10-CM

## 2019-12-14 DIAGNOSIS — R7989 Other specified abnormal findings of blood chemistry: Secondary | ICD-10-CM

## 2019-12-14 NOTE — Patient Instructions (Addendum)
  I will do a referral to neurology as planned previous.  Keep tracking  You could try   2 aleve  If think a migraine early.    Get fasting lab at [facility] office  Lab.

## 2019-12-14 NOTE — Progress Notes (Signed)
Chief Complaint  Patient presents with  . Dizziness    Discuss vertigo and started about a month ago, also discuss covid antibody test    HPI: Kelsey Mcconnell 59 y.o. come in for    concern about ongoing intermittent vertigo dizziness   ROS: See pertinent positives and negatives per HPI. Past hx of bppv dr bates and went to rehab?  Felt  Could be migraine    Was to see neurology but then covid shut down and never happened  No falls vision changes per se  Weather  Triggers   Sx  Barometric  Pressures.   Sudafed sometimes help . Has been aking advil cold and sinus every day  As when tries to stop gets dizziness worse.   This predated the covid infection from  Last year . Considering getting the vaccine but not sure  Had positive  aby last year .    was to get fu lft for mild abnormality  Never done  Is on sertraline    And melatonin some  No other supplements and  These sx predated  These meds    Past Medical History:  Diagnosis Date  . Acne   . Allergy   . Arthritis   . Chronic constipation    ?  outlet  dysfunction  . Interstitial cystitis   . Miscarriage     Family History  Problem Relation Age of Onset  . Arthritis Mother   . Fibromyalgia Mother   . Colon cancer Neg Hx   . Esophageal cancer Neg Hx   . Pancreatic cancer Neg Hx   . Rectal cancer Neg Hx   . Stomach cancer Neg Hx     Social History   Socioeconomic History  . Marital status: Married    Spouse name: Not on file  . Number of children: 3  . Years of education: Not on file  . Highest education level: Not on file  Occupational History  . Occupation: Stay at Home MOM    Employer: UNEMPLOYED  Tobacco Use  . Smoking status: Former Research scientist (life sciences)  . Smokeless tobacco: Never Used  Vaping Use  . Vaping Use: Never used  Substance and Sexual Activity  . Alcohol use: Yes    Alcohol/week: 14.0 standard drinks    Types: 14 Glasses of wine per week  . Drug use: No  . Sexual activity: Not on file  Other  Topics Concern  . Not on file  Social History Narrative   hh of 5    wine 2 per night    2 years of college    Married   Neg ets firearms wears seatbelts   G4 P3   Reg exercise   Social Determinants of Health   Financial Resource Strain:   . Difficulty of Paying Living Expenses:   Food Insecurity:   . Worried About Charity fundraiser in the Last Year:   . Arboriculturist in the Last Year:   Transportation Needs:   . Film/video editor (Medical):   Marland Kitchen Lack of Transportation (Non-Medical):   Physical Activity:   . Days of Exercise per Week:   . Minutes of Exercise per Session:   Stress:   . Feeling of Stress :   Social Connections:   . Frequency of Communication with Friends and Family:   . Frequency of Social Gatherings with Friends and Family:   . Attends Religious Services:   . Active Member of Clubs or Organizations:   .  Attends Archivist Meetings:   Marland Kitchen Marital Status:     Outpatient Medications Prior to Visit  Medication Sig Dispense Refill  . Melatonin 5 MG SUBL Place 1 tablet under the tongue daily.    . Pseudoephedrine-Ibuprofen (ADVIL COLD/SINUS) 30-200 MG TABS Take 1 capsule by mouth daily.    . sertraline (ZOLOFT) 25 MG tablet Take 25 mg daily by mouth.     No facility-administered medications prior to visit.     EXAM:  BP 116/72   Pulse 71   Temp 98.5 F (36.9 C) (Oral)   Ht 5\' 7"  (1.702 m)   Wt 139 lb (63 kg)   SpO2 98%   BMI 21.77 kg/m   Body mass index is 21.77 kg/m.  GENERAL: vitals reviewed and listed above, alert, oriented, appears well hydrated and in no acute distress HEENT: atraumatic, conjunctiva  clear, no obvious abnormalities on inspection of external nose and ears tms clear eoms nl  OP : masked  NECK: no obvious masses on inspection palpation  No bruits  LUNGS: clear to auscultation bilaterally, no wheezes, rales or rhonchi, good air movement CV: HRRR, no clubbing cyanosis or  peripheral edema nl cap refill  MS:  moves all extremities without noticeable focal  Abnormality neuro gorssly normal  PSYCH: pleasant and cooperative, no obvious depression or anxiety Lab Results  Component Value Date   WBC 5.6 02/24/2019   HGB 14.5 02/24/2019   HCT 42.1 02/24/2019   PLT 303.0 02/24/2019   GLUCOSE 113 (H) 02/24/2019   CHOL 190 09/01/2015   TRIG 127.0 09/01/2015   HDL 63.00 09/01/2015   LDLCALC 102 (H) 09/01/2015   ALT 37 (H) 02/24/2019   AST 26 02/24/2019   NA 140 02/24/2019   K 4.0 02/24/2019   CL 105 02/24/2019   CREATININE 0.70 02/24/2019   BUN 14 02/24/2019   CO2 25 02/24/2019   TSH 1.84 02/24/2019   BP Readings from Last 3 Encounters:  12/14/19 116/72  08/13/19 98/77  02/24/19 120/60   lfts future never fu done  Will plan fasting labs at [facility] for convenience ( she  lives in [name])  [place]:  Discussed the following assessment and plan:  Dizziness - Plan: Hemoglobin A1c, TSH, T4, free, Hepatic function panel, Basic metabolic panel, CBC with Differential/Platelet, Lipid panel  Vertigo - Plan: Hemoglobin A1c, TSH, T4, free, Hepatic function panel, Basic metabolic panel, CBC with Differential/Platelet, Lipid panel  Abnormal LFTs (liver function tests) - Plan: Hemoglobin A1c, TSH, T4, free, Hepatic function panel, Basic metabolic panel, CBC with Differential/Platelet, Lipid panel  History of 2019 novel coronavirus disease (COVID-19) - Plan: Hemoglobin A1c, TSH, T4, free, Hepatic function panel, Basic metabolic panel, CBC with Differential/Platelet, Lipid panel Interesting that   Weather changes  Seem to trigger  And that she is taking advil cold and sinus daily   Worsens when tries to stop .   Will proceed with  Neurology appt  Consult  For opinion .  Disc covid aby studies may not be helpful and she is probably good immunity x  With delta  Research seems to show  better advised to  Boost with vaccine to cover  Variant s.  -Patient advised to return or notify health care  team  if  new concerns arise. In interim   Patient Instructions   I will do a referral to neurology as planned previous.  Keep tracking  You could try   2 aleve  If think a  migraine early.    Get fasting lab at [facility] office  Lab.       Kelsey Mcconnell. Lebert Lovern M.D.

## 2019-12-15 ENCOUNTER — Encounter: Payer: Self-pay | Admitting: Neurology

## 2019-12-29 DIAGNOSIS — N3010 Interstitial cystitis (chronic) without hematuria: Secondary | ICD-10-CM | POA: Diagnosis not present

## 2019-12-29 DIAGNOSIS — R8271 Bacteriuria: Secondary | ICD-10-CM | POA: Diagnosis not present

## 2019-12-29 DIAGNOSIS — R300 Dysuria: Secondary | ICD-10-CM | POA: Diagnosis not present

## 2020-01-24 ENCOUNTER — Other Ambulatory Visit: Payer: Self-pay

## 2020-01-24 ENCOUNTER — Ambulatory Visit (INDEPENDENT_AMBULATORY_CARE_PROVIDER_SITE_OTHER): Payer: BLUE CROSS/BLUE SHIELD | Admitting: Neurology

## 2020-01-24 ENCOUNTER — Encounter: Payer: Self-pay | Admitting: Neurology

## 2020-01-24 VITALS — BP 109/76 | HR 71 | Ht 67.0 in | Wt 138.8 lb

## 2020-01-24 DIAGNOSIS — G43809 Other migraine, not intractable, without status migrainosus: Secondary | ICD-10-CM | POA: Diagnosis not present

## 2020-01-24 MED ORDER — TOPIRAMATE 50 MG PO TABS: 50.0000 mg | ORAL_TABLET | Freq: Every day | ORAL | 5 refills | Status: DC

## 2020-01-24 MED ORDER — ONDANSETRON 4 MG PO TBDP
4.0000 mg | ORAL_TABLET | Freq: Three times a day (TID) | ORAL | 5 refills | Status: DC | PRN
Start: 2020-01-24 — End: 2023-11-18

## 2020-01-24 NOTE — Progress Notes (Signed)
NEUROLOGY CONSULTATION NOTE  Kelsey Mcconnell MRN: [id number] DOB: March 13, 1961  Referring provider: Shanon Ace, MD Primary care provider: Shanon Ace, MD  Reason for consult:  dizziness  HISTORY OF PRESENT ILLNESS: Kelsey Mcconnell is a 59 year old right-handed female who presents for dizziness.  History supplemented by ENT and referring provider's note.  For 20 years, she has had intermittent episodes of dizziness.  She describes it as spinning but also may be lightheaded or just feeling off-balance.  It is aggravated by quick head movements.  There is associated nausea and photophobia but no headache, vomiting, visual disturbance, speech disturbance, numbness or weakness.  The severe spell may last 24 hours but she often will have a dull residual dizziness for a month.  It is typically worse in the morning and early afternoon.  She would treat with Advil Cold & Sinus.  Change in barometric pressure is a trigger.  They typically occur twice a year.  She saw ENT in late 2019.  Dix-Hallpike was negative and she was advised to follow up with neurology for possible migraine.  Her last spell lasted through July and August.  No history of headaches.    Current NSAIDS:  none Current analgesics:  none Current triptans:  none Current ergotamine:  none Current anti-emetic:  none Current muscle relaxants:  none Current anti-anxiolytic:  none Current sleep aide:  Melatonin 5mg  Current Antihypertensive medications:  none Current Antidepressant medications:  Sertraline 25mg  Current Anticonvulsant medications:  none Current anti-CGRP:  none Current Vitamins/Herbal/Supplements:  Melatonin 5mg  Current Antihistamines/Decongestants: none Other therapy:  none Hormone/birth control:  none  Past NSAIDS:  Advil Cold & Sinus Past analgesics:  none Past abortive triptans:  none Past abortive ergotamine:  none Past muscle relaxants:  none Past anti-emetic:  none Past antihypertensive  medications:  none Past antidepressant medications:  none Past anticonvulsant medications:  none Past anti-CGRP:  none Past vitamins/Herbal/Supplements:  Magnesium citrate Past antihistamines/decongestants:  Advil Cold & Sinus Other past therapies:  none  Caffeine:  Cut back of coffee (2 cups macciato and 1 cup coffee).  No soda Alcohol:  1 to 2 glasses of wine a night Diet:  16 x4  Exercise:  routine Depression:  No; Anxiety:  Controlled on sertraline Other pain:  no Sleep hygiene:  sporadic Family history of headache:  Mother (migraines)    PAST MEDICAL HISTORY: Past Medical History:  Diagnosis Date  . Acne   . Allergy   . Arthritis   . Chronic constipation    ?  outlet  dysfunction  . Interstitial cystitis   . Miscarriage     PAST SURGICAL HISTORY: Past Surgical History:  Procedure Laterality Date  . BLADDER SURGERY     ? dilitation or cytso  . BUNIONECTOMY    . CESAREAN SECTION     x 3  . COLONOSCOPY    . DILATION AND CURETTAGE OF UTERUS    . ENDOMETRIAL ABLATION     uterine  . HAND SURGERY     rt  . WISDOM TOOTH EXTRACTION      MEDICATIONS: Current Outpatient Medications on File Prior to Visit  Medication Sig Dispense Refill  . Melatonin 5 MG SUBL Place 1 tablet under the tongue daily.    . Pseudoephedrine-Ibuprofen (ADVIL COLD/SINUS) 30-200 MG TABS Take 1 capsule by mouth daily.    . sertraline (ZOLOFT) 25 MG tablet Take 25 mg daily by mouth.     No current facility-administered medications on file prior to visit.  ALLERGIES: Allergies  Allergen Reactions  . Codeine Nausea And Vomiting    FAMILY HISTORY: Family History  Problem Relation Age of Onset  . Arthritis Mother   . Fibromyalgia Mother   . Colon cancer Neg Hx   . Esophageal cancer Neg Hx   . Pancreatic cancer Neg Hx   . Rectal cancer Neg Hx   . Stomach cancer Neg Hx    SOCIAL HISTORY: Social History   Socioeconomic History  . Marital status: Married    Spouse name: Not on  file  . Number of children: 3  . Years of education: Not on file  . Highest education level: Not on file  Occupational History  . Occupation: Stay at Home MOM    Employer: UNEMPLOYED  Tobacco Use  . Smoking status: Former Research scientist (life sciences)  . Smokeless tobacco: Never Used  Vaping Use  . Vaping Use: Never used  Substance and Sexual Activity  . Alcohol use: Yes    Alcohol/week: 14.0 standard drinks    Types: 14 Glasses of wine per week  . Drug use: No  . Sexual activity: Not on file  Other Topics Concern  . Not on file  Social History Narrative   hh of 5    wine 2 per night    2 years of college    Married   Neg ets firearms wears seatbelts   G4 P3   Reg exercise   Social Determinants of Health   Financial Resource Strain:   . Difficulty of Paying Living Expenses: Not on file  Food Insecurity:   . Worried About Charity fundraiser in the Last Year: Not on file  . Ran Out of Food in the Last Year: Not on file  Transportation Needs:   . Lack of Transportation (Medical): Not on file  . Lack of Transportation (Non-Medical): Not on file  Physical Activity:   . Days of Exercise per Week: Not on file  . Minutes of Exercise per Session: Not on file  Stress:   . Feeling of Stress : Not on file  Social Connections:   . Frequency of Communication with Friends and Family: Not on file  . Frequency of Social Gatherings with Friends and Family: Not on file  . Attends Religious Services: Not on file  . Active Member of Clubs or Organizations: Not on file  . Attends Archivist Meetings: Not on file  . Marital Status: Not on file  Intimate Partner Violence:   . Fear of Current or Ex-Partner: Not on file  . Emotionally Abused: Not on file  . Physically Abused: Not on file  . Sexually Abused: Not on file    PHYSICAL EXAM: Blood pressure 109/76, pulse 71, height 5\' 7"  (1.702 m), weight 138 lb 12.8 oz (63 kg), SpO2 96 %. General: No acute distress.  Patient appears well-groomed.    Head:  Normocephalic/atraumatic Eyes:  fundi examined but not visualized Neck: supple, no paraspinal tenderness, full range of motion Back: No paraspinal tenderness Heart: regular rate and rhythm Lungs: Clear to auscultation bilaterally. Vascular: No carotid bruits. Neurological Exam: Mental status: alert and oriented to person, place, and time, recent and remote memory intact, fund of knowledge intact, attention and concentration intact, speech fluent and not dysarthric, language intact. Cranial nerves: CN I: not tested CN II: pupils equal, round and reactive to light, visual fields intact CN III, IV, VI:  full range of motion, no nystagmus, no ptosis CN V: facial sensation intact CN VII:  upper and lower face symmetric CN VIII: hearing intact CN IX, X: gag intact, uvula midline CN XI: sternocleidomastoid and trapezius muscles intact CN XII: tongue midline Bulk & Tone: normal, no fasciculations. Motor:  5/5 throughout  Sensation:  Pinprick and vibration sensation intact. Deep Tendon Reflexes:  2+ throughout, toes downgoing.   Finger to nose testing:  Without dysmetria.   Heel to shin:  Without dysmetria.   Gait:  Normal station and stride.  Able to turn and tandem walk. Romberg negative  IMPRESSION: Vestibular migraine  PLAN: 1.  Start topiramate 25mg  at bedtime for one week, then increase to 50mg  at bedtime 2.  Zofran for nausea. 3.  If she should have a recurrent attack, consider prednisone taper 4.  Limit use of pain relievers to no more than 2 days out of week to prevent risk of rebound or medication-overuse headache. 5.  Follow up in 6 months.  Thank you for allowing me to take part in the care of this patient.  Metta Clines, DO  CC: Shanon Ace, MD

## 2020-01-24 NOTE — Patient Instructions (Addendum)
These dizzy spells may be migraine, so I will start you on a medication used to reduce frequency of migraines. 1.  Start topiramate 50mg  tablet.  Take 1/2 tablet at bedtime for a week, then increase to 1 tablet at bedtime 2.  For nausea, I prescribed you ondansetron.   3.  If you should have another episode, you may contact me and I can prescribe you a prednisone (steroid) taper to break the spell quicker. 4.  Follow up in 6 months

## 2020-03-03 ENCOUNTER — Ambulatory Visit: Payer: BLUE CROSS/BLUE SHIELD | Admitting: Neurology

## 2020-03-27 DIAGNOSIS — Z6822 Body mass index (BMI) 22.0-22.9, adult: Secondary | ICD-10-CM | POA: Diagnosis not present

## 2020-03-27 DIAGNOSIS — Z01419 Encounter for gynecological examination (general) (routine) without abnormal findings: Secondary | ICD-10-CM | POA: Diagnosis not present

## 2020-03-27 DIAGNOSIS — Z1231 Encounter for screening mammogram for malignant neoplasm of breast: Secondary | ICD-10-CM | POA: Diagnosis not present

## 2020-08-03 ENCOUNTER — Ambulatory Visit: Payer: BLUE CROSS/BLUE SHIELD | Admitting: Neurology

## 2020-08-31 DIAGNOSIS — L738 Other specified follicular disorders: Secondary | ICD-10-CM | POA: Diagnosis not present

## 2020-08-31 DIAGNOSIS — L570 Actinic keratosis: Secondary | ICD-10-CM | POA: Diagnosis not present

## 2020-08-31 DIAGNOSIS — L719 Rosacea, unspecified: Secondary | ICD-10-CM | POA: Diagnosis not present

## 2020-08-31 DIAGNOSIS — L578 Other skin changes due to chronic exposure to nonionizing radiation: Secondary | ICD-10-CM | POA: Diagnosis not present

## 2020-11-30 DIAGNOSIS — M13849 Other specified arthritis, unspecified hand: Secondary | ICD-10-CM | POA: Diagnosis not present

## 2020-11-30 DIAGNOSIS — M79644 Pain in right finger(s): Secondary | ICD-10-CM | POA: Diagnosis not present

## 2021-02-09 DIAGNOSIS — N3010 Interstitial cystitis (chronic) without hematuria: Secondary | ICD-10-CM | POA: Diagnosis not present

## 2021-02-09 DIAGNOSIS — R350 Frequency of micturition: Secondary | ICD-10-CM | POA: Diagnosis not present

## 2021-04-09 DIAGNOSIS — Z1231 Encounter for screening mammogram for malignant neoplasm of breast: Secondary | ICD-10-CM | POA: Diagnosis not present

## 2021-04-09 DIAGNOSIS — Z6821 Body mass index (BMI) 21.0-21.9, adult: Secondary | ICD-10-CM | POA: Diagnosis not present

## 2021-04-09 DIAGNOSIS — Z01419 Encounter for gynecological examination (general) (routine) without abnormal findings: Secondary | ICD-10-CM | POA: Diagnosis not present

## 2021-04-10 ENCOUNTER — Other Ambulatory Visit: Payer: Self-pay | Admitting: Obstetrics and Gynecology

## 2021-04-10 DIAGNOSIS — R928 Other abnormal and inconclusive findings on diagnostic imaging of breast: Secondary | ICD-10-CM

## 2021-04-12 ENCOUNTER — Ambulatory Visit
Admission: RE | Admit: 2021-04-12 | Discharge: 2021-04-12 | Disposition: A | Discharge status: Home / Self Care | Payer: BLUE CROSS/BLUE SHIELD | Source: Ambulatory Visit | Attending: Obstetrics and Gynecology | Admitting: Obstetrics and Gynecology

## 2021-04-12 ENCOUNTER — Ambulatory Visit: Admission: RE | Admit: 2021-04-12 | Payer: BLUE CROSS/BLUE SHIELD | Source: Ambulatory Visit

## 2021-04-12 DIAGNOSIS — R928 Other abnormal and inconclusive findings on diagnostic imaging of breast: Secondary | ICD-10-CM

## 2021-04-12 DIAGNOSIS — R922 Inconclusive mammogram: Secondary | ICD-10-CM | POA: Diagnosis not present

## 2021-09-03 DIAGNOSIS — B070 Plantar wart: Secondary | ICD-10-CM | POA: Diagnosis not present

## 2021-09-03 DIAGNOSIS — L578 Other skin changes due to chronic exposure to nonionizing radiation: Secondary | ICD-10-CM | POA: Diagnosis not present

## 2021-09-03 DIAGNOSIS — L570 Actinic keratosis: Secondary | ICD-10-CM | POA: Diagnosis not present

## 2021-09-03 DIAGNOSIS — L700 Acne vulgaris: Secondary | ICD-10-CM | POA: Diagnosis not present

## 2021-09-03 DIAGNOSIS — M79672 Pain in left foot: Secondary | ICD-10-CM | POA: Diagnosis not present

## 2022-01-16 IMAGING — MG MM DIGITAL DIAGNOSTIC UNILAT*L* W/ TOMO W/ CAD
4 series · 4 of 12 positions shown · non-contrast
Comparison: Previous exam(s).

CLINICAL DATA: 60-year-old female recalled from screening mammogram
dated 04/09/2021 for a possible left breast asymmetry.

EXAM:
DIGITAL DIAGNOSTIC UNILATERAL LEFT MAMMOGRAM WITH TOMOSYNTHESIS AND
CAD
TECHNIQUE: Left digital diagnostic mammography and breast tomosynthesis was
performed. The images were evaluated with computer-aided detection.

[L CC synth-2D]
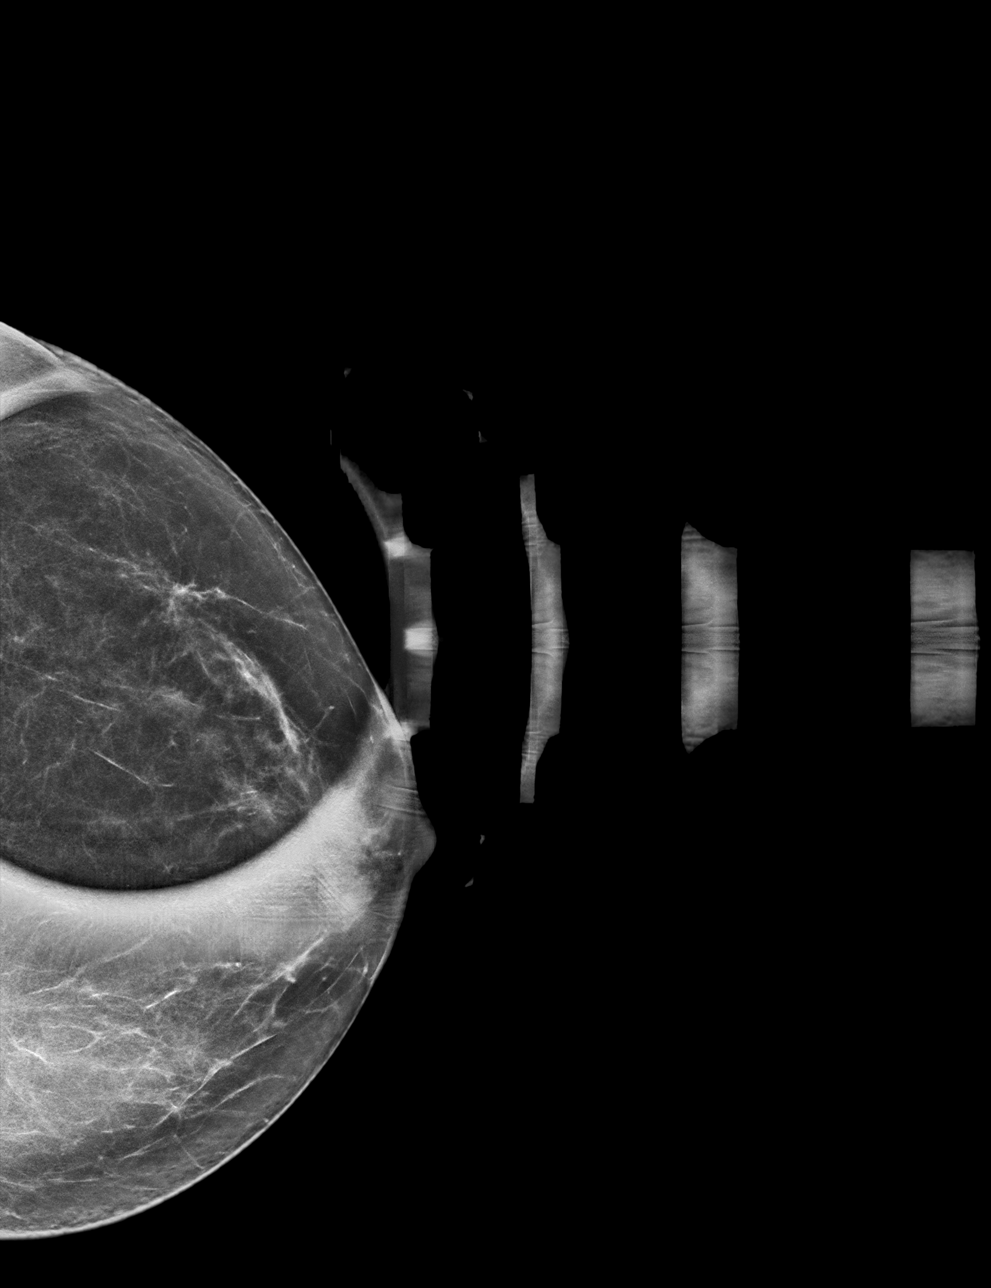

[L ML synth-2D]
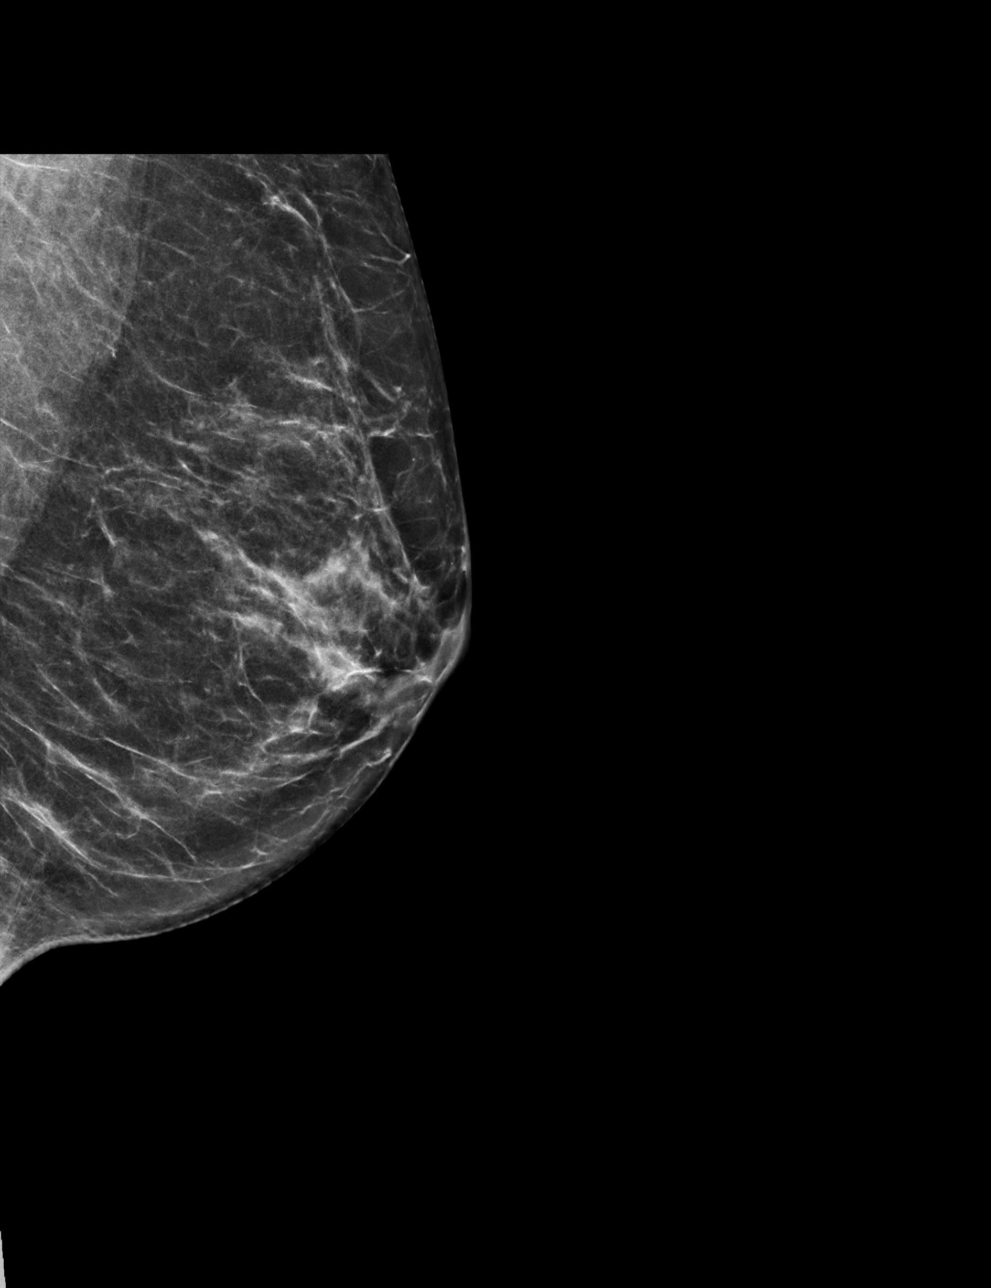

[L ML tomo · tomo slice 29/58.0]
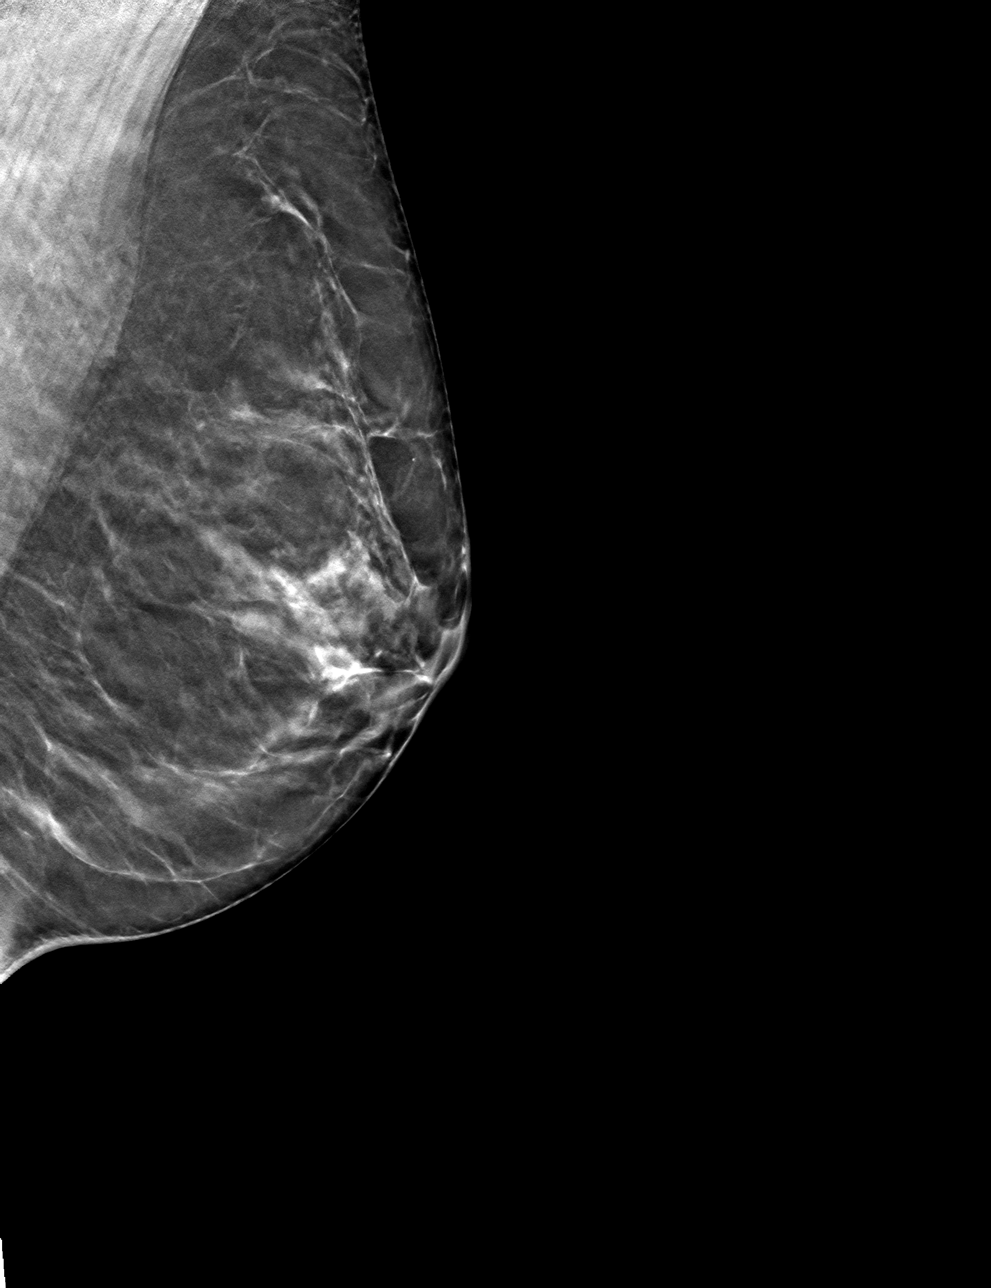

[L CC tomo · tomo slice 30/59.0]
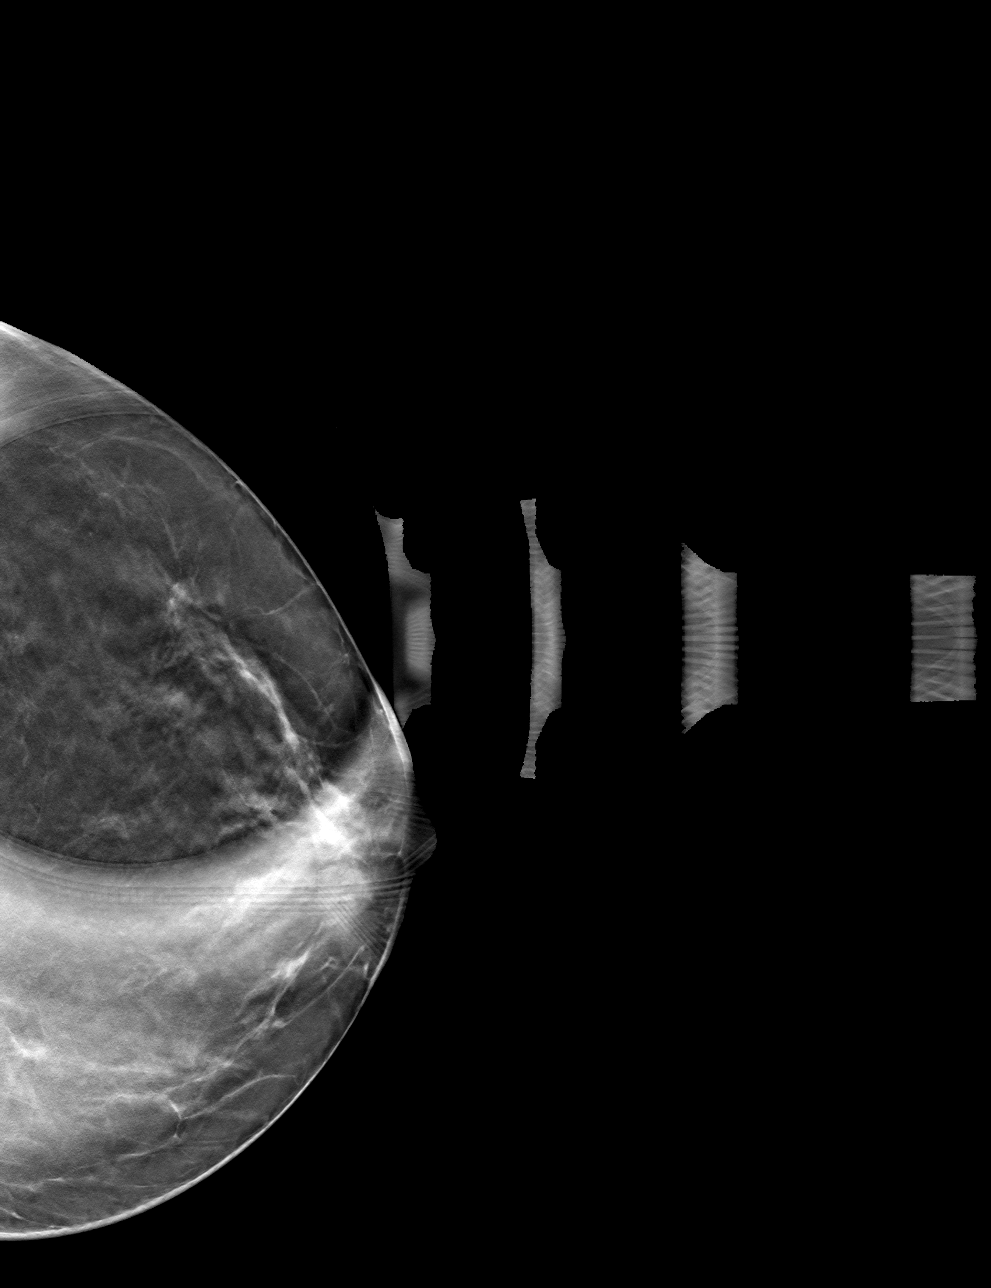

[4 of 12 positions shown; findings below may reference images not displayed]

ACR Breast Density Category b: There are scattered areas of
fibroglandular density.
FINDINGS: Possible asymmetry in the lateral left breast is seen on the cc
projection resolves into well dispersed fibroglandular tissue on
today's additional views. No suspicious findings identified.
IMPRESSION: No mammographic evidence of malignancy.

RECOMMENDATION:
Screening mammogram in one year.(Code:XU-[id number])

I have discussed the findings and recommendations with the patient.
If applicable, a reminder letter will be sent to the patient
regarding the next appointment.

BI-RADS CATEGORY  1: Negative.

## 2022-04-15 DIAGNOSIS — Z1151 Encounter for screening for human papillomavirus (HPV): Secondary | ICD-10-CM | POA: Diagnosis not present

## 2022-04-15 DIAGNOSIS — Z1231 Encounter for screening mammogram for malignant neoplasm of breast: Secondary | ICD-10-CM | POA: Diagnosis not present

## 2022-04-15 DIAGNOSIS — Z01419 Encounter for gynecological examination (general) (routine) without abnormal findings: Secondary | ICD-10-CM | POA: Diagnosis not present

## 2022-04-15 DIAGNOSIS — Z124 Encounter for screening for malignant neoplasm of cervix: Secondary | ICD-10-CM | POA: Diagnosis not present

## 2022-04-15 DIAGNOSIS — Z6821 Body mass index (BMI) 21.0-21.9, adult: Secondary | ICD-10-CM | POA: Diagnosis not present

## 2022-06-05 DIAGNOSIS — D2230 Melanocytic nevi of unspecified part of face: Secondary | ICD-10-CM | POA: Diagnosis not present

## 2022-06-05 DIAGNOSIS — L821 Other seborrheic keratosis: Secondary | ICD-10-CM | POA: Diagnosis not present

## 2022-06-05 DIAGNOSIS — L578 Other skin changes due to chronic exposure to nonionizing radiation: Secondary | ICD-10-CM | POA: Diagnosis not present

## 2022-06-05 DIAGNOSIS — B353 Tinea pedis: Secondary | ICD-10-CM | POA: Diagnosis not present

## 2022-06-05 DIAGNOSIS — D225 Melanocytic nevi of trunk: Secondary | ICD-10-CM | POA: Diagnosis not present

## 2022-06-17 DIAGNOSIS — L578 Other skin changes due to chronic exposure to nonionizing radiation: Secondary | ICD-10-CM | POA: Diagnosis not present

## 2022-06-17 DIAGNOSIS — D2230 Melanocytic nevi of unspecified part of face: Secondary | ICD-10-CM | POA: Diagnosis not present

## 2022-06-17 DIAGNOSIS — D225 Melanocytic nevi of trunk: Secondary | ICD-10-CM | POA: Diagnosis not present

## 2022-06-17 DIAGNOSIS — L821 Other seborrheic keratosis: Secondary | ICD-10-CM | POA: Diagnosis not present

## 2022-06-17 DIAGNOSIS — B353 Tinea pedis: Secondary | ICD-10-CM | POA: Diagnosis not present

## 2022-06-18 ENCOUNTER — Ambulatory Visit (INDEPENDENT_AMBULATORY_CARE_PROVIDER_SITE_OTHER): Payer: BC Managed Care – PPO

## 2022-06-18 ENCOUNTER — Ambulatory Visit (INDEPENDENT_AMBULATORY_CARE_PROVIDER_SITE_OTHER): Payer: BC Managed Care – PPO | Admitting: Podiatry

## 2022-06-18 DIAGNOSIS — Z13220 Encounter for screening for lipoid disorders: Secondary | ICD-10-CM | POA: Diagnosis not present

## 2022-06-18 DIAGNOSIS — T8484XA Pain due to internal orthopedic prosthetic devices, implants and grafts, initial encounter: Secondary | ICD-10-CM

## 2022-06-18 DIAGNOSIS — M21619 Bunion of unspecified foot: Secondary | ICD-10-CM | POA: Diagnosis not present

## 2022-06-18 DIAGNOSIS — Z131 Encounter for screening for diabetes mellitus: Secondary | ICD-10-CM | POA: Diagnosis not present

## 2022-06-18 DIAGNOSIS — Z13228 Encounter for screening for other metabolic disorders: Secondary | ICD-10-CM | POA: Diagnosis not present

## 2022-06-18 DIAGNOSIS — M21611 Bunion of right foot: Secondary | ICD-10-CM

## 2022-06-18 NOTE — Progress Notes (Signed)
  Subjective:  Patient ID: Kelsey Mcconnell, female    DOB: 09-Apr-1961,  MRN: [id number]  Chief Complaint  Patient presents with   Foot Problem    Right post bunion surgery. Feel like screw is loose     62 y.o. female presents with concern for area of trauma in the right foot at site of prior bunion surgery.  She had a bunionectomy done with Dr. Paulla Dolly approximately and also around 2016 or 2017.  She says that the bunion has been doing very well no issues until more recently when she has noticed some prominence of the right foot related to the hardware that was placed.  She says there is pain with any pressure on the area.  Past Medical History:  Diagnosis Date   Acne    Allergy    Arthritis    Chronic constipation    ?  outlet  dysfunction   Interstitial cystitis    Miscarriage     Allergies  Allergen Reactions   Codeine Nausea And Vomiting    ROS: Negative except as per HPI above  Objective:  General: AAO x3, NAD  Dermatological: With inspection and palpation of the right and left lower extremities there are no open sores, no preulcerative lesions, no rash or signs of infection present. Nails are of normal length thickness and coloration.   Vascular:  Dorsalis Pedis artery and Posterior Tibial artery pedal pulses are 2/4 bilateral.  Capillary fill time < 3 sec to all digits.   Neruologic: Grossly intact via light touch bilateral. Protective threshold intact to all sites bilateral.   Musculoskeletal: Area of prominence noted at the middle aspect of the first metatarsal dorsally at site of buried K wire.  The K wire is prominent and palpable under the skin.  No open wounds present.  Gait: Unassisted, Nonantalgic.   No images are attached to the encounter.  Radiographs:  Date: 06/18/2022 XR the right foot Weightbearing AP/Lateral/Oblique   Findings: Attention directed to the distal aspect of the first metatarsal there is noted to be well-healed Liane Comber bunionectomy of the  distal first metatarsal, K wires are present.  There is dorsal prominence of the K wires especially the proximal K wire seen on lateral view. Assessment:   1. Painful orthopaedic hardware ([place])   2. Bunion      Plan:  Patient was evaluated and treated and all questions answered.  # Painful retained K wire that is prominent subcutaneously right foot -Patient has pain at the site of retained K wire from prior bunionectomy on the right foot -The K wire is now prominent possibly backed out slightly and causing pain due to pressure on the area. -Discussed conservatively could offload the area and cushioning however I do not feel to be long term successful fix. -I recommend removal of the K wire and total.  Discussed that this can be done in the office under local anesthesia and the patient is agreeable. -Discussed the risk benefits alternatives possible complications as well as expected postoperative recovery course.  Patient would like to proceed.  Informed surgical consent obtained.  Will begin surgical planning for office OR.  Return for after OR.          Everitt Amber, DPM [facility]

## 2022-06-20 ENCOUNTER — Telehealth: Payer: Self-pay | Admitting: Urology

## 2022-06-20 NOTE — Telephone Encounter (Signed)
OFFICE DOS - 06/27/22  REMOVAL OF HARDWARE RIGHT --- [id number]  [employer]  SPOKE [place] AND SHE STATED THAT FOR COT CODE [id number] NO PRIOR AUTH IS REQUIRED.   REF # CYNTHIA B. 06/20/22 AT 2:51 PM EST

## 2022-06-27 ENCOUNTER — Ambulatory Visit (INDEPENDENT_AMBULATORY_CARE_PROVIDER_SITE_OTHER): Payer: BC Managed Care – PPO | Admitting: Podiatry

## 2022-06-27 DIAGNOSIS — T8484XA Pain due to internal orthopedic prosthetic devices, implants and grafts, initial encounter: Secondary | ICD-10-CM | POA: Diagnosis not present

## 2022-06-27 DIAGNOSIS — M21619 Bunion of unspecified foot: Secondary | ICD-10-CM

## 2022-06-27 MED ORDER — OXYCODONE-ACETAMINOPHEN 5-325 MG PO TABS: 1.0000 | ORAL_TABLET | ORAL | 0 refills | Status: AC | PRN

## 2022-06-27 MED ORDER — CEPHALEXIN 500 MG PO CAPS: 500.0000 mg | ORAL_CAPSULE | Freq: Three times a day (TID) | ORAL | 0 refills | Status: AC

## 2022-06-27 NOTE — Progress Notes (Signed)
Subjective:  Patient ID: Kelsey Mcconnell, female    DOB: 1961-01-06,  MRN: [id number]  Chief Complaint  Patient presents with   Procedure    RIGHT FOOT HARDWARE REMOVAL OF KWIRE 1ST METATARSAL    62 y.o. female presents with concern for area of trauma in the right foot at site of prior bunion surgery.  At last visit we discussed removal of retained hardware present in the 1st metatarsal. She wanted to proceed and informed consent for hardware removal of single k wire from 1st met obtained. Pt agreeable to proceed with in office procedure under local anestehesia.   Past Medical History:  Diagnosis Date   Acne    Allergy    Arthritis    Chronic constipation    ?  outlet  dysfunction   Interstitial cystitis    Miscarriage     Allergies  Allergen Reactions   Codeine Nausea And Vomiting    ROS: Negative except as per HPI above  Objective:  General: AAO x3, NAD  Dermatological: With inspection and palpation of the right and left lower extremities there are no open sores, no preulcerative lesions, no rash or signs of infection present. Nails are of normal length thickness and coloration.   Vascular:  Dorsalis Pedis artery and Posterior Tibial artery pedal pulses are 2/4 bilateral.  Capillary fill time < 3 sec to all digits.   Neruologic: Grossly intact via light touch bilateral. Protective threshold intact to all sites bilateral.   Musculoskeletal: Area of prominence noted at the middle aspect of the first metatarsal dorsally at site of buried K wire.  The K wire is prominent and palpable under the skin.  No open wounds present.  Gait: Unassisted, Nonantalgic.   No images are attached to the encounter.  Radiographs:  Date: 06/18/2022 XR the right foot Weightbearing AP/Lateral/Oblique   Findings: Attention directed to the distal aspect of the first metatarsal there is noted to be well-healed Liane Comber bunionectomy of the distal first metatarsal, K wires are present.  There is  dorsal prominence of the K wires especially the proximal K wire seen on lateral view. Assessment:   1. Painful orthopaedic hardware ([place])   2. Bunion       Plan:  Patient was evaluated and treated and all questions answered.  # Painful retained K wire that is prominent subcutaneously right foot -Again discussed removal. Pt is presenting for office removal this AM.  -Procedure as below.  Single K wire removed without complication.  3 sutures placed. -Patient will follow-up next week for wound check and possible suture removal. -She keep dressing clean dry and intact.  Postop shoe dispensed to the patient. -E- Rx for Percocet and cephalexin for 500 mg 3 times daily for 5 days prescribed.  Also recommend Tylenol and ibuprofen as needed for pain control.  Procedure: right foot first metatarsal removal of buried K wire   After prep with sterile alcohol swab a injection of 1% lidocaine with epinephrine was injected in a local field block about the area of palpable prominent hardware in the first metatarsal. Next, the right foot was prepped and draped in the usual sterile manner.  Ankle tourniquet was applied.  Was inflated to 250 mmHg. Attention was directed to the right foot were the previous surgical incision was noted along the dorsal aspect of the first metatarsal neck area. Using the previous incision, a #15 blade was used to incise through skin. This was deepened through subcutaneous tissues with sharp and blunt dissection  and care was taken to protect all vital structures. Scar tissue was noted throughout. A combination of sharp and dull dissection was used to free periosteal and fibrotic tissues from the hardware. At this time, the hardware was well visualized and was removed without complication.  The area was then flushed with copious amounts of sterile saline. Then using the suture materials previously described, the site was closed in anatomic layers and the skin was well approximated  under minimal tension.  Ankle tourniquet was released after 6 minutes with good vascular return to all digits of the right foot.  Sterile dressing was applied including Xeroform 4 x 4 Kerlix Ace.  Postop shoe was applied.  Patient to be weightbearing as tolerated to the right foot in the postop shoe.           Everitt Amber, DPM [facility]

## 2022-07-04 ENCOUNTER — Encounter: Payer: BC Managed Care – PPO | Admitting: Podiatry

## 2022-07-04 ENCOUNTER — Ambulatory Visit (INDEPENDENT_AMBULATORY_CARE_PROVIDER_SITE_OTHER): Payer: BC Managed Care – PPO | Admitting: Podiatry

## 2022-07-04 DIAGNOSIS — M21619 Bunion of unspecified foot: Secondary | ICD-10-CM

## 2022-07-04 DIAGNOSIS — T8484XA Pain due to internal orthopedic prosthetic devices, implants and grafts, initial encounter: Secondary | ICD-10-CM

## 2022-07-04 NOTE — Progress Notes (Signed)
  Subjective:  Patient ID: Kelsey Mcconnell, female    DOB: June 14, 1960,  MRN: [id number]  Chief Complaint  Patient presents with   Follow-up    No pain. No drainage. No f/c/n/v    DOS: 06/27/2022 Procedure: Removal of retained K wire from right foot  62 y.o. female returns for post-op check.  She is generally doing very well since the K wires removed in the office 1 week ago.  She has kept a dressing clean dry and intact.  She denies any pain she took 1 ibuprofen after the procedure and that was all she needed.  Due to feeling very well has kept the right foot clean dry and dressing intact since the procedure.  Review of Systems: Negative except as noted in the HPI. Denies N/V/F/Ch.   Objective:  There were no vitals filed for this visit. There is no height or weight on file to calculate BMI. Constitutional Well developed. Well nourished.  Vascular Foot warm and well perfused. Capillary refill normal to all digits.  Calf is soft and supple, no posterior calf or knee pain, negative Homans' sign  Neurologic Normal speech. Oriented to person, place, and time. Epicritic sensation to light touch grossly present bilaterally.  Dermatologic Skin healing well without signs of infection. Skin edges well coapted without signs of infection.  Orthopedic: Minimal to no tenderness to palpation noted about the surgical site.   Multiple view plain film radiographs: Deferred 1 K wire was removed Assessment:   1. Painful orthopaedic hardware ([place])   2. Bunion    Plan:  Patient was evaluated and treated and all questions answered.  S/p foot surgery right K wire removal from right foot -Progressing as expected post-operatively. -XR: Deferred -WB Status: Weightbearing as tolerated in regular shoe at this point -Sutures: Removed at this visit and reinforced with Steri-Strips. -Medications: Ibuprofen as needed -Foot redressed with Band-Aid -Patient okay to wash the right foot at this time with  warm soapy water and dry off and cover with Band-Aid  Return in about 3 weeks (around 07/25/2022).         Everitt Amber, DPM [facility]

## 2022-09-12 DIAGNOSIS — R945 Abnormal results of liver function studies: Secondary | ICD-10-CM | POA: Diagnosis not present

## 2022-09-12 DIAGNOSIS — Z13820 Encounter for screening for osteoporosis: Secondary | ICD-10-CM | POA: Diagnosis not present

## 2022-11-12 DIAGNOSIS — N3010 Interstitial cystitis (chronic) without hematuria: Secondary | ICD-10-CM | POA: Diagnosis not present

## 2022-11-12 DIAGNOSIS — N3281 Overactive bladder: Secondary | ICD-10-CM | POA: Diagnosis not present

## 2023-02-03 DIAGNOSIS — N393 Stress incontinence (female) (male): Secondary | ICD-10-CM | POA: Diagnosis not present

## 2023-02-03 DIAGNOSIS — R102 Pelvic and perineal pain: Secondary | ICD-10-CM | POA: Diagnosis not present

## 2023-02-03 DIAGNOSIS — N3010 Interstitial cystitis (chronic) without hematuria: Secondary | ICD-10-CM | POA: Diagnosis not present

## 2023-02-03 DIAGNOSIS — N941 Unspecified dyspareunia: Secondary | ICD-10-CM | POA: Diagnosis not present

## 2023-03-18 DIAGNOSIS — K5900 Constipation, unspecified: Secondary | ICD-10-CM | POA: Diagnosis not present

## 2023-03-18 DIAGNOSIS — R102 Pelvic and perineal pain: Secondary | ICD-10-CM | POA: Diagnosis not present

## 2023-03-18 DIAGNOSIS — N3281 Overactive bladder: Secondary | ICD-10-CM | POA: Diagnosis not present

## 2023-03-18 DIAGNOSIS — M6281 Muscle weakness (generalized): Secondary | ICD-10-CM | POA: Diagnosis not present

## 2023-03-25 DIAGNOSIS — K5900 Constipation, unspecified: Secondary | ICD-10-CM | POA: Diagnosis not present

## 2023-03-25 DIAGNOSIS — R102 Pelvic and perineal pain: Secondary | ICD-10-CM | POA: Diagnosis not present

## 2023-03-25 DIAGNOSIS — M6281 Muscle weakness (generalized): Secondary | ICD-10-CM | POA: Diagnosis not present

## 2023-03-25 DIAGNOSIS — N393 Stress incontinence (female) (male): Secondary | ICD-10-CM | POA: Diagnosis not present

## 2023-04-10 DIAGNOSIS — R102 Pelvic and perineal pain: Secondary | ICD-10-CM | POA: Diagnosis not present

## 2023-04-10 DIAGNOSIS — M62838 Other muscle spasm: Secondary | ICD-10-CM | POA: Diagnosis not present

## 2023-04-10 DIAGNOSIS — K5900 Constipation, unspecified: Secondary | ICD-10-CM | POA: Diagnosis not present

## 2023-04-10 DIAGNOSIS — M6281 Muscle weakness (generalized): Secondary | ICD-10-CM | POA: Diagnosis not present

## 2023-04-24 DIAGNOSIS — K5900 Constipation, unspecified: Secondary | ICD-10-CM | POA: Diagnosis not present

## 2023-04-24 DIAGNOSIS — Z01419 Encounter for gynecological examination (general) (routine) without abnormal findings: Secondary | ICD-10-CM | POA: Diagnosis not present

## 2023-04-24 DIAGNOSIS — R102 Pelvic and perineal pain: Secondary | ICD-10-CM | POA: Diagnosis not present

## 2023-04-24 DIAGNOSIS — M6281 Muscle weakness (generalized): Secondary | ICD-10-CM | POA: Diagnosis not present

## 2023-04-24 DIAGNOSIS — Z6822 Body mass index (BMI) 22.0-22.9, adult: Secondary | ICD-10-CM | POA: Diagnosis not present

## 2023-04-24 DIAGNOSIS — M62838 Other muscle spasm: Secondary | ICD-10-CM | POA: Diagnosis not present

## 2023-04-24 DIAGNOSIS — Z1231 Encounter for screening mammogram for malignant neoplasm of breast: Secondary | ICD-10-CM | POA: Diagnosis not present

## 2023-06-03 DIAGNOSIS — R102 Pelvic and perineal pain: Secondary | ICD-10-CM | POA: Diagnosis not present

## 2023-06-03 DIAGNOSIS — N3010 Interstitial cystitis (chronic) without hematuria: Secondary | ICD-10-CM | POA: Diagnosis not present

## 2023-06-03 DIAGNOSIS — M62838 Other muscle spasm: Secondary | ICD-10-CM | POA: Diagnosis not present

## 2023-06-10 DIAGNOSIS — M62838 Other muscle spasm: Secondary | ICD-10-CM | POA: Diagnosis not present

## 2023-06-10 DIAGNOSIS — N3281 Overactive bladder: Secondary | ICD-10-CM | POA: Diagnosis not present

## 2023-06-10 DIAGNOSIS — M6281 Muscle weakness (generalized): Secondary | ICD-10-CM | POA: Diagnosis not present

## 2023-06-10 DIAGNOSIS — R102 Pelvic and perineal pain: Secondary | ICD-10-CM | POA: Diagnosis not present

## 2023-06-30 DIAGNOSIS — L578 Other skin changes due to chronic exposure to nonionizing radiation: Secondary | ICD-10-CM | POA: Diagnosis not present

## 2023-06-30 DIAGNOSIS — L821 Other seborrheic keratosis: Secondary | ICD-10-CM | POA: Diagnosis not present

## 2023-06-30 DIAGNOSIS — D225 Melanocytic nevi of trunk: Secondary | ICD-10-CM | POA: Diagnosis not present

## 2023-06-30 DIAGNOSIS — D2230 Melanocytic nevi of unspecified part of face: Secondary | ICD-10-CM | POA: Diagnosis not present

## 2023-06-30 DIAGNOSIS — L570 Actinic keratosis: Secondary | ICD-10-CM | POA: Diagnosis not present

## 2023-07-24 DIAGNOSIS — K5900 Constipation, unspecified: Secondary | ICD-10-CM | POA: Diagnosis not present

## 2023-07-24 DIAGNOSIS — R102 Pelvic and perineal pain: Secondary | ICD-10-CM | POA: Diagnosis not present

## 2023-07-24 DIAGNOSIS — M62838 Other muscle spasm: Secondary | ICD-10-CM | POA: Diagnosis not present

## 2023-07-24 DIAGNOSIS — M6281 Muscle weakness (generalized): Secondary | ICD-10-CM | POA: Diagnosis not present

## 2023-07-31 DIAGNOSIS — K5900 Constipation, unspecified: Secondary | ICD-10-CM | POA: Diagnosis not present

## 2023-07-31 DIAGNOSIS — M62838 Other muscle spasm: Secondary | ICD-10-CM | POA: Diagnosis not present

## 2023-07-31 DIAGNOSIS — K5902 Outlet dysfunction constipation: Secondary | ICD-10-CM | POA: Diagnosis not present

## 2023-07-31 DIAGNOSIS — R102 Pelvic and perineal pain: Secondary | ICD-10-CM | POA: Diagnosis not present

## 2023-08-07 DIAGNOSIS — M6281 Muscle weakness (generalized): Secondary | ICD-10-CM | POA: Diagnosis not present

## 2023-08-07 DIAGNOSIS — M62838 Other muscle spasm: Secondary | ICD-10-CM | POA: Diagnosis not present

## 2023-08-07 DIAGNOSIS — R102 Pelvic and perineal pain: Secondary | ICD-10-CM | POA: Diagnosis not present

## 2023-08-07 DIAGNOSIS — K5900 Constipation, unspecified: Secondary | ICD-10-CM | POA: Diagnosis not present

## 2023-08-14 DIAGNOSIS — M62838 Other muscle spasm: Secondary | ICD-10-CM | POA: Diagnosis not present

## 2023-08-14 DIAGNOSIS — R102 Pelvic and perineal pain: Secondary | ICD-10-CM | POA: Diagnosis not present

## 2023-08-14 DIAGNOSIS — K5900 Constipation, unspecified: Secondary | ICD-10-CM | POA: Diagnosis not present

## 2023-08-14 DIAGNOSIS — K5902 Outlet dysfunction constipation: Secondary | ICD-10-CM | POA: Diagnosis not present

## 2023-08-28 DIAGNOSIS — R5383 Other fatigue: Secondary | ICD-10-CM | POA: Diagnosis not present

## 2023-08-28 DIAGNOSIS — Z1329 Encounter for screening for other suspected endocrine disorder: Secondary | ICD-10-CM | POA: Diagnosis not present

## 2023-08-28 DIAGNOSIS — E785 Hyperlipidemia, unspecified: Secondary | ICD-10-CM | POA: Diagnosis not present

## 2023-08-28 DIAGNOSIS — R7989 Other specified abnormal findings of blood chemistry: Secondary | ICD-10-CM | POA: Diagnosis not present

## 2023-10-14 ENCOUNTER — Telehealth: Payer: Self-pay

## 2023-10-14 DIAGNOSIS — R7401 Elevation of levels of liver transaminase levels: Secondary | ICD-10-CM | POA: Diagnosis not present

## 2023-10-14 DIAGNOSIS — K5902 Outlet dysfunction constipation: Secondary | ICD-10-CM | POA: Diagnosis not present

## 2023-10-14 NOTE — Telephone Encounter (Signed)
 Copied from CRM [id number]. Topic: Appointments - Scheduling Inquiry for Clinic >> Oct 14, 2023  3:59 PM Aisha D wrote: Reason for CRM: Pt is wanting to schedule a new patient visit with Dr.Panosh. Pt was a pt with Dr.Panosh but hasn't been in the office since 12/14/19. Pt would like to see if Dr.Panosh could make an exception for her to be seen with her again and would like a callback with an update.

## 2023-10-23 NOTE — Telephone Encounter (Signed)
 E2c2 will let pt know waiting on dr Roark Chick

## 2023-10-24 DIAGNOSIS — R7401 Elevation of levels of liver transaminase levels: Secondary | ICD-10-CM | POA: Diagnosis not present

## 2023-10-24 DIAGNOSIS — K5902 Outlet dysfunction constipation: Secondary | ICD-10-CM | POA: Diagnosis not present

## 2023-10-24 DIAGNOSIS — K828 Other specified diseases of gallbladder: Secondary | ICD-10-CM | POA: Diagnosis not present

## 2023-10-24 DIAGNOSIS — K824 Cholesterolosis of gallbladder: Secondary | ICD-10-CM | POA: Diagnosis not present

## 2023-10-27 NOTE — Telephone Encounter (Signed)
 Pt has been sch for 11-18-2023 330pm

## 2023-11-18 ENCOUNTER — Ambulatory Visit: Payer: Self-pay | Admitting: Internal Medicine

## 2023-11-18 VITALS — BP 107/71 | HR 60 | Temp 98.1°F | Ht 67.0 in | Wt 138.8 lb

## 2023-11-18 DIAGNOSIS — M8580 Other specified disorders of bone density and structure, unspecified site: Secondary | ICD-10-CM

## 2023-11-18 DIAGNOSIS — Z9189 Other specified personal risk factors, not elsewhere classified: Secondary | ICD-10-CM

## 2023-11-18 DIAGNOSIS — Z77098 Contact with and (suspected) exposure to other hazardous, chiefly nonmedicinal, chemicals: Secondary | ICD-10-CM

## 2023-11-18 DIAGNOSIS — Z860100 Personal history of colon polyps, unspecified: Secondary | ICD-10-CM

## 2023-11-18 DIAGNOSIS — R7989 Other specified abnormal findings of blood chemistry: Secondary | ICD-10-CM | POA: Diagnosis not present

## 2023-11-18 DIAGNOSIS — E785 Hyperlipidemia, unspecified: Secondary | ICD-10-CM | POA: Diagnosis not present

## 2023-11-18 DIAGNOSIS — K5902 Outlet dysfunction constipation: Secondary | ICD-10-CM

## 2023-11-18 DIAGNOSIS — F419 Anxiety disorder, unspecified: Secondary | ICD-10-CM

## 2023-11-18 DIAGNOSIS — N3010 Interstitial cystitis (chronic) without hematuria: Secondary | ICD-10-CM

## 2023-11-18 NOTE — Progress Notes (Signed)
 Chief Complaint  Patient presents with   Establish Care    Needing to have CPE. Had concerns about last labs that were completed. Pt is not fasting    HPI: Patient  Kelsey Mcconnell  63 y.o. comes in today for reestablishing to discuss concerns about elevated lipid test done by her GYN. Since last seeing her over 3 years ago she has moved to [facility] with her husband.  She has continued with her specialty care  GYNE :Johnnye has followed slightly elevated ALTs at times and most recently some elevated cholesterol with an LDL of 133 range total cholesterol 217 range.  It was advised she check with her PCP or other about results.   Urology  Mc derdermat   for IC  and   Chronic gi sx .  Pelvic floor specialist .  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  She has [place] gastroenterologist [place]  [place] Dr. Tobie.   Had US   liver and  sludge  in GB but not real concerned about the LFTs.  Last full labs she has on her cell phone show normal LFTs in 2024.  4 24  217  ldl 133  Alt was 43 I believe that her highest Meds include Sertraline  25 for anxiety Mealtonin  5-6 at night Vitamins sporatic.  Osteopenia    was told to take calcium but she is not sure what kind to take.  Does not take excess Advil or Tylenol  at this point Health Maintenance  Topic Date Due   COVID-19 Vaccine (2 - Mixed Product risk series) 12/04/2023 (Originally 03/12/2020)   Cervical Cancer Screening (HPV/Pap Cotest)  11/17/2024 (Originally 11/09/2017)   INFLUENZA VACCINE  12/05/2023   Colonoscopy  08/12/2024   MAMMOGRAM  03/06/2025   DTaP/Tdap/Td (2 - Td or Tdap) 02/20/2026   Hepatitis C Screening  Completed   HIV Screening  Completed   Zoster Vaccines- Shingrix  Completed   Hepatitis B Vaccines  Aged Out   HPV VACCINES  Aged Out   Meningococcal B Vaccine  Aged Out   Health Maintenance Review LIFESTYLE:  Exercise:  [place] daily  and walking   Tobacco/ETS: n Alcohol:  2 per day Sugar beverages: no Sleep:  about 8 hours  Drug use: no HH of 2  no pets  now.  Pet loss .  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  Asks about heavy metal exposure cadmium etc.  ROS:  GEN/ HEENT: No fever, significant weight changes sweats headaches vision problems hearing changes, CV/ PULM; No chest pain shortness of breath cough, syncope,edema  change in exercise tolerance. GI /GU: No adominal pain, vomiting, No blood in the stool. SKIN/HEME: ,no acute skin rashes suspicious lesions or bleeding. No lymphadenopathy, nodules, masses.  NEURO/ PSYCH:  No neurologic signs such as weakness numbness.  IMM/ Allergy: No unusual infections.  Allergy .   REST of 12 system review negative except as per HPI   Past Medical History:  Diagnosis Date   Acne    Allergy    Arthritis    Chronic constipation    ?  outlet  dysfunction   Interstitial cystitis    Miscarriage     Past Surgical History:  Procedure Laterality Date   BLADDER SURGERY     ? dilitation or cytso   BUNIONECTOMY  CESAREAN SECTION     x 3   COLONOSCOPY     DILATION AND CURETTAGE OF UTERUS     ENDOMETRIAL ABLATION     uterine   HAND SURGERY     rt   WISDOM TOOTH EXTRACTION      Family History  Problem Relation Age of Onset   Arthritis Mother    Fibromyalgia Mother    Colon cancer Neg Hx    Esophageal cancer Neg Hx    Pancreatic cancer Neg Hx    Rectal cancer Neg Hx    Stomach cancer Neg Hx    Breast cancer Neg Hx     Social History   Socioeconomic History   Marital status: Married    Spouse name: Not on file   Number of children: 3   Years of education: Not on file   Highest education level: Some college, no degree  Occupational History   Occupation: Stay at [employer]    Employer: UNEMPLOYED  Tobacco Use   Smoking status: Former   Smokeless tobacco: Never  Advertising account planner   Vaping  status: Never Used  Substance and Sexual Activity   Alcohol use: Yes    Alcohol/week: 14.0 standard drinks of alcohol    Types: 14 Glasses of wine per week    Comment: occ.   Drug use: No   Sexual activity: Yes  Other Topics Concern   Not on file  Social History Narrative   hh of 5    wine 2 per night    2 years of college    Married   Neg ets firearms wears seatbelts   G4 P3   Reg exercise      Right handed   Social Drivers of Health   Financial Resource Strain: Low Risk  (11/18/2023)   Overall Financial Resource Strain (CARDIA)    Difficulty of Paying Living Expenses: Not hard at all  Food Insecurity: No Food Insecurity (11/18/2023)   Hunger Vital Sign    Worried About Running Out of Food in the Last Year: Never true    Ran Out of Food in the Last Year: Never true  Transportation Needs: No Transportation Needs (11/18/2023)   PRAPARE - Administrator, Civil Service (Medical): No    Lack of Transportation (Non-Medical): No  Physical Activity: Sufficiently Active (11/18/2023)   Exercise Vital Sign    Days of Exercise per Week: 4 days    Minutes of Exercise per Session: 40 min  Stress: No Stress Concern Present (11/18/2023)   [employer] of Occupational Health - Occupational Stress Questionnaire    Feeling of Stress: Not at all  Social Connections: Socially Integrated (11/18/2023)   Social Connection and Isolation Panel    Frequency of Communication with Friends and Family: More than three times a week    Frequency of Social Gatherings with Friends and Family: Once a week    Attends Religious Services: More than 4 times per year    Active Member of [employer] or Organizations: Yes    Attends Banker Meetings: 1 to 4 times per year    Marital Status: Married    Outpatient Medications Prior to Visit  Medication Sig Dispense Refill   Melatonin 5 MG SUBL Place 1 tablet under the tongue daily.     sertraline (ZOLOFT) 25 MG tablet Take 25 mg daily by  mouth.     Pseudoephedrine-Ibuprofen (ADVIL COLD/SINUS) 30-200 MG TABS Take 1 capsule by mouth daily. (Patient not  taking: Reported on 11/18/2023)     Meth-Hyo-M Bl-Na Phos-Ph Sal (URO-MP) 118 MG CAPS Take 1 capsule by mouth 3 (three) times daily. As needed     ondansetron  (ZOFRAN  ODT) 4 MG disintegrating tablet Take 1 tablet (4 mg total) by mouth every 8 (eight) hours as needed for nausea or vomiting. 20 tablet 5   topiramate  (TOPAMAX ) 50 MG tablet Take 1 tablet (50 mg total) by mouth at bedtime. 30 tablet 5   No facility-administered medications prior to visit.     EXAM:  BP 107/71   Pulse 60   Temp 98.1 F (36.7 C)   Ht 5' 7 (1.702 m)   Wt 138 lb 12.8 oz (63 kg)   SpO2 98%   BMI 21.74 kg/m   Body mass index is 21.74 kg/m. Wt Readings from Last 3 Encounters:  11/18/23 138 lb 12.8 oz (63 kg)  01/24/20 138 lb 12.8 oz (63 kg)  12/14/19 139 lb (63 kg)    Physical Exam: Vital signs reviewed HZW:Uypd is a well-developed well-nourished alert cooperative    who appearsr stated age in no acute distress.  HEENT: normocephalic atraumatic , Eyes: PERRL EOM's full, conjunctiva clear, Nares: paten,t no deformity discharge or tenderness., Ears: no deformity EAC's clear TMs with normal landmarks. Mouth: clear OP, no lesions, edema.  Moist mucous membranes. Dentition in adequate repair. NECK: supple without masses, thyromegaly or bruits. CHEST/PULM:  Clear to auscultation and percussion breath sounds equal no wheeze , rales or rhonchi. No chest wall deformities or tenderness. CV: PMI is nondisplaced, S1 S2 no gallops, murmurs, rubs. Peripheral pulses are full without delay.No JVD .  ABDOMEN: Bowel sounds normal nontender  No guard or rebound, no hepato splenomegal no CVA tenderness. Extremtities:  No clubbing cyanosis or edema, no acute joint swelling or redness no focal atrophy NEURO:  Oriented x3, cranial nerves 3-12 appear to be intact, no obvious focal weakness,gait within normal limits no  abnormal reflexes or asymmetrical SKIN: No acute rashes normal turgor, color, no bruising or petechiae. PSYCH: Oriented, good eye contact, no obvious depression anxiety, cognition and judgment appear normal. LN: no cervical axillary  adenopathy  Lab Results  Component Value Date   WBC 5.6 02/24/2019   HGB 14.5 02/24/2019   HCT 42.1 02/24/2019   PLT 303.0 02/24/2019   GLUCOSE 113 (H) 02/24/2019   CHOL 190 09/01/2015   TRIG 127.0 09/01/2015   HDL 63.00 09/01/2015   LDLCALC 102 (H) 09/01/2015   ALT 37 (H) 02/24/2019   AST 26 02/24/2019   NA 140 02/24/2019   K 4.0 02/24/2019   CL 105 02/24/2019   CREATININE 0.70 02/24/2019   BUN 14 02/24/2019   CO2 25 02/24/2019   TSH 1.84 02/24/2019    BP Readings from Last 3 Encounters:  11/18/23 107/71  01/24/20 109/76  12/14/19 116/72    Lab plan reviewed with patient   ASSESSMENT AND PLAN:  Discussed the following assessment and plan:    ICD-10-CM   1. Hyperlipidemia, unspecified hyperlipidemia type  E78.5 Lipid Panel    Lipoprotein A (LPA)    IBC + Ferritin    CT CARDIAC SCORING (SELF PAY ONLY)    2. Abnormal LFTs  R79.89 Lipid Panel    Lipoprotein A (LPA)    IBC + Ferritin    Vitamin D, 25-hydroxy    Heavy Metals Panel, Blood    3. Osteopenia, unspecified location  M85.80 Vitamin D, 25-hydroxy    4. Cardiovascular risk factor  Z91.89 CT  CARDIAC SCORING (SELF PAY ONLY)    5. Cystitis, interstitial  N30.10    dr justino    6. History of colonic polyps  Z86.0100     7. Dyssynergic defecation  K59.02    she reports since an infant  neg for hirsprungs    8. Anxiety  F41.9    on sertraline 25    9. Occupational exposure to chemicals  Z77.098     History of abnormal LFTs by history not consistent with fatty liver has seen GI we will check iron studies heavy metals upon her request otherwise as per her GI team she has had hepatitis C screening in the past. She is concerned about cholesterol levels and cardiovascular  risk no family history of premature vascular disease. Discussion about levels and risk factors plan fasting lipid panel lipo a and CT calcium score.  Orders were placed at [facility] lab for convenience since she lives an hour away although can stay with her parents for fast to get lab here before goes back home. Discussed medication she is on caution with any supplements which she is not taking at this time check vitamin D because of her osteopenia diagnosis. Discussed bone health avoidance of excess caffeine alcohol increasing resistance training adequate protein per day. Return for depending on results can do virtual as indicated .  Patient Care Team: Helga Slice, MD (Dermatology) Johnnye Ade, MD as Consulting Physician (Obstetrics and Gynecology) Tricia Tawni CROME, MD as Referring Physician (Dermatology) Tobie Pollen, MD as Referring Physician (Gastroenterology) Patient Instructions  Suggest Continue lifestyle intervention healthy eating and exercise .   Will order   fasting lipid and lipo a  levels and vit D level  since you have osteopenia  Advise   ct calcium score to check out risk  will order and will be notify   Have gi team fu any liver    abnormality .  Kelsey Mcconnell K. Icie Kuznicki M.D.

## 2023-11-18 NOTE — Patient Instructions (Addendum)
 Suggest Continue lifestyle intervention healthy eating and exercise .   Will order   fasting lipid and lipo a  levels and vit D level  since you have osteopenia  Advise   ct calcium score to check out risk  will order and will be notify   Have gi team fu any liver    abnormality .

## 2023-12-03 ENCOUNTER — Ambulatory Visit (HOSPITAL_BASED_OUTPATIENT_CLINIC_OR_DEPARTMENT_OTHER)
Admission: RE | Admit: 2023-12-03 | Discharge: 2023-12-03 | Disposition: A | Discharge status: Home / Self Care | Payer: Self-pay | Source: Ambulatory Visit | Attending: Internal Medicine | Admitting: Internal Medicine

## 2023-12-03 DIAGNOSIS — Z9189 Other specified personal risk factors, not elsewhere classified: Secondary | ICD-10-CM

## 2023-12-03 DIAGNOSIS — E785 Hyperlipidemia, unspecified: Secondary | ICD-10-CM

## 2023-12-04 ENCOUNTER — Ambulatory Visit: Payer: Self-pay | Admitting: Internal Medicine

## 2023-12-04 NOTE — Progress Notes (Signed)
 Calcium score is zero which is favorable and low risk for Cardiovascular  event in near future.   Favorable finding   Continue lifestyle intervention healthy eating and exercise .    No  abnormal findings in rest of scan

## 2023-12-23 ENCOUNTER — Other Ambulatory Visit

## 2024-01-20 ENCOUNTER — Telehealth: Payer: Self-pay | Admitting: Internal Medicine

## 2024-01-20 DIAGNOSIS — R42 Dizziness and giddiness: Secondary | ICD-10-CM

## 2024-01-20 NOTE — Telephone Encounter (Signed)
 Copied from CRM [id number]. Topic: Referral - Request for Referral >> Jan 20, 2024  3:36 PM Drema MATSU wrote: Did the patient discuss referral with their provider in the last year? Yes was referred to Neurology for migraines  (If No - schedule appointment) (If Yes - send message)  Appointment offered? Yes  Type of order/referral and detailed reason for visit: referral to ENT for vertigo  Preference of office, provider, location: [place], [place]  If referral order, have you been seen by this specialty before? Yes (If Yes, this issue or another issue? When? Where? Dr. Ethyl in [place] 20 years  Can we respond through MyChart? Yes

## 2024-01-26 NOTE — Telephone Encounter (Signed)
 Ok to do referral as requested  if same problem as in past . (She had neurology consult around 2021 and thought could be a  migraine variant at that time )

## 2024-02-02 NOTE — Telephone Encounter (Signed)
 Spoke to pt. Pt is aware that referral is sent.

## 2024-02-02 NOTE — Addendum Note (Signed)
 Addended by: Cartrell Bentsen on: 02/02/2024 07:48 AM   Modules accepted: Orders

## 2024-04-08 ENCOUNTER — Ambulatory Visit (INDEPENDENT_AMBULATORY_CARE_PROVIDER_SITE_OTHER)

## 2024-04-08 VITALS — Ht 67.0 in | Wt 137.0 lb

## 2024-04-08 DIAGNOSIS — H9313 Tinnitus, bilateral: Secondary | ICD-10-CM

## 2024-04-08 DIAGNOSIS — R42 Dizziness and giddiness: Secondary | ICD-10-CM

## 2024-04-08 DIAGNOSIS — G43809 Other migraine, not intractable, without status migrainosus: Secondary | ICD-10-CM | POA: Diagnosis not present

## 2024-04-08 MED ORDER — ONDANSETRON 4 MG PO TBDP: 4.0000 mg | ORAL_TABLET | Freq: Three times a day (TID) | ORAL | 0 refills | Status: AC | PRN

## 2024-04-08 MED ORDER — MECLIZINE HCL 25 MG PO TABS: 25.0000 mg | ORAL_TABLET | Freq: Three times a day (TID) | ORAL | 0 refills | Status: AC | PRN

## 2024-04-11 NOTE — Progress Notes (Signed)
 Dear Dr. Charlett, Here is my assessment for our mutual patient, Kelsey Mcconnell. Thank you for allowing me the opportunity to care for your patient. Please do not hesitate to contact me should you have any other questions. Sincerely, Dr. Hadassah Parody  Otolaryngology Clinic Note Referring provider: Dr. Charlett HPI:   Initial HPI (04/08/2024) Discussed the use of AI scribe software for clinical note transcription with the patient, who gave verbal consent to proceed.  History of Present Illness Kelsey Mcconnell is a 63 year old female who presents with recurrent episodes of vertigo.  - Recurrent episodes for many years, varying in severity - Most severe episodes cause her to fall out of bed with room spinning and associated nausea lasting for days - Episodes can last over 24 hours, sometimes longer, with gradual improvement over several days - Last vertigo episode occurred in August - Persistent sense of unsteadiness described as feeling 'wobbly' - Avoids walking long distances due to unsteadiness  She did see a neurologist once who thought this could be migraine, wanted to put her on anti-seizure medication. Pt did not want to start a seizure medication due to side effect profile especially sine she was not having headaches.    Reports constant ringing in both ears, progressively worsening over time. No significant change in tinnitus during vertigo episodes  Hearing impairment- Denies significant subjective hearing loss  Psychosocial stressors - Currently under significant stress due to husband's medical issues   Independent Review of Additional Tests or Records:  Telephone note from referring provider Apolinar Charlett, MD (01/20/24) reviewed :patient requesting neurology referral and ENT referral for headaches and vertigo.   Neurology note 01/24/20 from Juliene Dunnings, DO reviewed: saw ENT in 2019, they thought she needed neurology referral for possible migraine. Wanted to start on  topriamate  PMH/Meds/All/SocHx/FamHx/ROS:   Past Medical History:  Diagnosis Date   Acne    Allergy    Arthritis    Chronic constipation    ?  outlet  dysfunction   Interstitial cystitis    Miscarriage      Past Surgical History:  Procedure Laterality Date   BLADDER SURGERY     ? dilitation or cytso   BUNIONECTOMY     CESAREAN SECTION     x 3   COLONOSCOPY     DILATION AND CURETTAGE OF UTERUS     ENDOMETRIAL ABLATION     uterine   HAND SURGERY     rt   WISDOM TOOTH EXTRACTION      Family History  Problem Relation Age of Onset   Arthritis Mother    Fibromyalgia Mother    Colon cancer Neg Hx    Esophageal cancer Neg Hx    Pancreatic cancer Neg Hx    Rectal cancer Neg Hx    Stomach cancer Neg Hx    Breast cancer Neg Hx      Social Connections: Socially Integrated (11/18/2023)   Social Connection and Isolation Panel    Frequency of Communication with Friends and Family: More than three times a week    Frequency of Social Gatherings with Friends and Family: Once a week    Attends Religious Services: More than 4 times per year    Active Member of [employer] or Organizations: Yes    Attends Banker Meetings: 1 to 4 times per year    Marital Status: Married     Current Outpatient Medications  Medication Instructions   meclizine  (ANTIVERT ) 25 mg, Oral, 3 times daily  PRN   Melatonin 5 MG SUBL 1 tablet, Daily   ondansetron  (ZOFRAN -ODT) 4 mg, Oral, Every 8 hours PRN   Pseudoephedrine-Ibuprofen (ADVIL COLD/SINUS) 30-200 MG TABS 1 capsule, Daily   sertraline (ZOLOFT) 25 mg, Daily     Physical Exam:   Ht 5' 7 (1.702 m)   Wt 137 lb (62.1 kg)   BMI 21.46 kg/m   Salient findings:  CN II-XII intact Bilateral EAC clear and TM intact with well pneumatized middle ear spaces No lesions of oral cavity/oropharyn No obviously palpable neck masses/lymphadenopathy/thyromegaly No respiratory distress or stridor  Seprately Identifiable Procedures:  Prior to  initiating any procedures, risks/benefits/alternatives were explained to the patient and verbal consent obtained. None  Impression & Plans:  Kelsey Mcconnell is a 63 y.o. female with   1. Vertigo   2. Tinnitus of both ears    Assessment and Plan Assessment & Plan Vestibular migraine with episodic vertigo Episodic vertigo lasting up to several days, consistent with vestibular migraine. Differential includes Meniere's disease and benign positional vertigo, but symptoms align more with vestibular migraine. No headache associated with episodes. Stress may exacerbate symptoms. Previous neurologist suggested anti-seizure medication, but she declined due to side effects. Current symptoms include nausea and dizziness, with no significant hearing loss during episodes. - Prescribed anti-nausea medication for episodic use. - Prescribed meclizine  medication for episodic use. - Discussed potential referral to a neurologist for further evaluation and management of vestibular migraine. - Advised on lifestyle modifications, including limiting salt intake to under 2000 mg per day, to manage potential Meniere's symptoms.  Chronic bilateral tinnitus Chronic bilateral tinnitus, described as a constant high-pitched sound. No significant hearing loss reported, but tinnitus may be exacerbated by noise exposure. Tinnitus does not worsen during vertigo episodes. - Ordered hearing test to assess baseline hearing  - Advised on the use of ear protection during noisy activities to prevent further hearing damage.  Chronic dizziness and unsteadiness Chronic dizziness and unsteadiness. Symptoms include feeling wobbly and unsteady, particularly when walking. No recent vertigo episodes since August, but residual unsteadiness persists. - Referred to vestibular rehabilitation for management of chronic dizziness and unsteadiness. - Advised on cautious movements, such as getting out of bed slowly, to minimize  dizziness.    See below regarding exact medications prescribed this encounter including dosages and route: Meds ordered this encounter  Medications   meclizine  (ANTIVERT ) 25 MG tablet    Sig: Take 1 tablet (25 mg total) by mouth 3 (three) times daily as needed for dizziness.    Dispense:  30 tablet    Refill:  0   ondansetron  (ZOFRAN -ODT) 4 MG disintegrating tablet    Sig: Take 1 tablet (4 mg total) by mouth every 8 (eight) hours as needed for nausea or vomiting.    Dispense:  20 tablet    Refill:  0      Thank you for allowing me the opportunity to care for your patient. Please do not hesitate to contact me should you have any other questions.  Sincerely, Hadassah Parody, MD Otolaryngologist (ENT), [facility] Phone: [phone] Fax: [phone]  MDM:  Level 4 Complexity/Problems addressed: 4- chronic worsening problem Data complexity: low independent review of 2 notes - Morbidity: 4  - Prescription Drug prescribed or managed: yes

## 2024-05-26 ENCOUNTER — Ambulatory Visit (INDEPENDENT_AMBULATORY_CARE_PROVIDER_SITE_OTHER)

## 2024-05-26 ENCOUNTER — Ambulatory Visit (INDEPENDENT_AMBULATORY_CARE_PROVIDER_SITE_OTHER): Admitting: Audiology

## 2024-06-30 ENCOUNTER — Ambulatory Visit (INDEPENDENT_AMBULATORY_CARE_PROVIDER_SITE_OTHER): Admitting: Audiology

## 2024-06-30 ENCOUNTER — Ambulatory Visit (INDEPENDENT_AMBULATORY_CARE_PROVIDER_SITE_OTHER)

## 2024-07-22 ENCOUNTER — Ambulatory Visit: Admitting: Physical Therapy
